# Patient Record
Sex: Female | Born: 1970 | Race: White | Hispanic: No | Marital: Married | Smoking: Never smoker
Health system: Southern US, Community
[De-identification: ages and names within clinical notes are randomized; demographics above are authoritative.]

## PROBLEM LIST (undated history)

## (undated) DIAGNOSIS — Z8669 Personal history of other diseases of the nervous system and sense organs: Secondary | ICD-10-CM

## (undated) DIAGNOSIS — I10 Essential (primary) hypertension: Secondary | ICD-10-CM

## (undated) DIAGNOSIS — E785 Hyperlipidemia, unspecified: Secondary | ICD-10-CM

## (undated) DIAGNOSIS — I8390 Asymptomatic varicose veins of unspecified lower extremity: Secondary | ICD-10-CM

## (undated) DIAGNOSIS — H05822 Myopathy of extraocular muscles, left orbit: Secondary | ICD-10-CM

## (undated) HISTORY — PX: COLONOSCOPY: SHX174

## (undated) HISTORY — PX: OTHER SURGICAL HISTORY: SHX169

## (undated) HISTORY — DX: Essential (primary) hypertension: I10

## (undated) HISTORY — DX: Asymptomatic varicose veins of unspecified lower extremity: I83.90

## (undated) HISTORY — DX: Myopathy of extraocular muscles, left orbit: H05.822

## (undated) HISTORY — DX: Personal history of other diseases of the nervous system and sense organs: Z86.69

## (undated) HISTORY — DX: Hyperlipidemia, unspecified: E78.5

---

## 2009-01-06 HISTORY — PX: APPENDECTOMY: SHX54

## 2011-10-13 ENCOUNTER — Ambulatory Visit (INDEPENDENT_AMBULATORY_CARE_PROVIDER_SITE_OTHER): Payer: 59 | Admitting: Physician Assistant

## 2011-10-13 VITALS — BP 126/90 | HR 92 | Temp 97.8°F | Resp 16 | Ht 65.5 in | Wt 210.0 lb

## 2011-10-13 DIAGNOSIS — J029 Acute pharyngitis, unspecified: Secondary | ICD-10-CM

## 2011-10-13 DIAGNOSIS — J019 Acute sinusitis, unspecified: Secondary | ICD-10-CM

## 2011-10-13 MED ORDER — CLARITHROMYCIN ER 500 MG PO TB24: 1000.0000 mg | ORAL_TABLET | Freq: Every day | ORAL | Status: AC

## 2011-10-13 MED ORDER — IPRATROPIUM BROMIDE 0.03 % NA SOLN: 2.0000 | Freq: Two times a day (BID) | NASAL | Status: DC

## 2011-10-13 MED ORDER — GUAIFENESIN ER 1200 MG PO TB12: 1.0000 | ORAL_TABLET | Freq: Two times a day (BID) | ORAL | Status: DC | PRN

## 2011-10-13 MED ORDER — AMBULATORY NON FORMULARY MEDICATION: Status: DC

## 2011-10-13 NOTE — Progress Notes (Signed)
  Subjective:    Patient ID: Kathy Morris, female    DOB: [birth year], 41 y.o.   MRN: [id number]  HPI This 41 y.o. Female presents for sore throat x 2 weeks.  Seen at PCP 8 days ago where rapid strep test was negative.  Has since developed cough, productive of yellow sputum, and sinus congestion.  Low grade fever and abdominal pain initially, now resolved.  NO HA, nausea, vomiting, diarrhea.  Initially, her kids had sore throat and abdominal pain, but are now well.  Visiting here from [place].  Review of Systems  As above.  History reviewed. No pertinent past medical history.  Past Surgical History  Procedure Date  . Appendectomy 01/06/2009    Prior to Admission medications   Not on File    Allergies  Allergen Reactions  . Amoxicillin Hives  . Tetracyclines & Related Hives    History   Social History  . Marital Status: Married    Spouse Name: Piedad Standiford    Number of Children: 2  . Years of Education: 16   Occupational History  . SALES    Social History Main Topics  . Smoking status: Never Smoker   . Smokeless tobacco: Never Used  . Alcohol Use: 4.2 oz/week    7 Glasses of wine per week  . Drug Use: No  . Sexually Active: Yes -- Female partner(s)    Birth Control/ Protection: Condom   Other Topics Concern  . Not on file   Social History Narrative  . No narrative on file    Family History  Problem Relation Age of Onset  . Thyroid disease Mother   . Cancer Father 84    colon       Objective:   Physical Exam Blood pressure 126/90, pulse 92, temperature 97.8 F (36.6 C), temperature source Oral, resp. rate 16, height 5' 5.5" (1.664 m), weight 210 lb (95.255 kg), last menstrual period 09/22/2011, SpO2 98.00%. Body mass index is 34.41 kg/(m^2). Well-developed, well nourished WF who is awake, alert and oriented, in NAD. HEENT: /AT, PERRL, EOMI.  Sclera and conjunctiva are clear.  EAC are patent, TMs are normal in appearance. Nasal mucosa is  pink and moist. OP is clear. Neck: supple, non-tender, no lymphadenopathy, thyromegaly. Heart: RRR, no murmur Lungs: CTA Extremities: no cyanosis, clubbing or edema. Skin: warm and dry without rash.     Assessment & Plan:   1. Acute pharyngitis  AMBULATORY NON FORMULARY MEDICATION  2. Acute sinusitis, unspecified  clarithromycin (BIAXIN XL) 500 MG 24 hr tablet, ipratropium (ATROVENT) 0.03 % nasal spray, Guaifenesin (MUCINEX MAXIMUM STRENGTH) 1200 MG TB12   Patient Instructions  Get plenty of rest and drink at least 64 ounces of water daily.

## 2011-10-13 NOTE — Patient Instructions (Signed)
Get plenty of rest and drink at least 64 ounces of water daily. 

## 2014-10-02 ENCOUNTER — Emergency Department (HOSPITAL_COMMUNITY)
Admission: EM | Admit: 2014-10-02 | Discharge: 2014-10-02 | Disposition: A | Discharge status: Home / Self Care | Payer: 59 | Attending: Emergency Medicine | Admitting: Emergency Medicine

## 2014-10-02 ENCOUNTER — Encounter (HOSPITAL_COMMUNITY): Payer: Self-pay | Admitting: Family Medicine

## 2014-10-02 ENCOUNTER — Emergency Department (HOSPITAL_COMMUNITY): Payer: 59

## 2014-10-02 ENCOUNTER — Other Ambulatory Visit: Payer: Self-pay

## 2014-10-02 ENCOUNTER — Encounter (HOSPITAL_COMMUNITY): Payer: Self-pay | Admitting: *Deleted

## 2014-10-02 DIAGNOSIS — Z880 Allergy status to penicillin: Secondary | ICD-10-CM | POA: Insufficient documentation

## 2014-10-02 DIAGNOSIS — M549 Dorsalgia, unspecified: Secondary | ICD-10-CM | POA: Insufficient documentation

## 2014-10-02 DIAGNOSIS — E860 Dehydration: Secondary | ICD-10-CM | POA: Insufficient documentation

## 2014-10-02 DIAGNOSIS — H5712 Ocular pain, left eye: Secondary | ICD-10-CM

## 2014-10-02 DIAGNOSIS — R000 Tachycardia, unspecified: Secondary | ICD-10-CM | POA: Diagnosis not present

## 2014-10-02 DIAGNOSIS — Z79899 Other long term (current) drug therapy: Secondary | ICD-10-CM | POA: Diagnosis not present

## 2014-10-02 DIAGNOSIS — R002 Palpitations: Secondary | ICD-10-CM | POA: Diagnosis not present

## 2014-10-02 LAB — BASIC METABOLIC PANEL
Anion gap: 9 (ref 5–15)
BUN: 9 mg/dL (ref 6–20)
CHLORIDE: 103 mmol/L (ref 101–111)
CO2: 27 mmol/L (ref 22–32)
Calcium: 9.3 mg/dL (ref 8.9–10.3)
Creatinine, Ser: 0.76 mg/dL (ref 0.44–1.00)
GFR calc Af Amer: >60 mL/min (ref >60)
Glucose, Bld: 104 mg/dL — ABNORMAL HIGH (ref 65–99)
POTASSIUM: 3.8 mmol/L (ref 3.5–5.1)
Sodium: 139 mmol/L (ref 135–145)

## 2014-10-02 LAB — CBC
HEMATOCRIT: 43.4 % (ref 36.0–46.0)
HEMOGLOBIN: 14.8 g/dL (ref 12.0–15.0)
MCH: 30.5 pg (ref 26.0–34.0)
MCHC: 34.1 g/dL (ref 30.0–36.0)
MCV: 89.5 fL (ref 78.0–100.0)
Platelets: 228 10*3/uL (ref 150–400)
RBC: 4.85 MIL/uL (ref 3.87–5.11)
RDW: 13.7 % (ref 11.5–15.5)
WBC: 12.5 10*3/uL — AB (ref 4.0–10.5)

## 2014-10-02 LAB — I-STAT TROPONIN, ED: Troponin i, poc: 0 ng/mL (ref 0.00–0.08)

## 2014-10-02 MED ORDER — FLUORESCEIN SODIUM 1 MG OP STRP
1.0000 | ORAL_STRIP | Freq: Once | OPHTHALMIC | Status: AC
  Administered 2014-10-02: 1 via OPHTHALMIC
  Filled 2014-10-02: qty 1

## 2014-10-02 MED ORDER — TETRACAINE HCL 0.5 % OP SOLN
1.0000 [drp] | Freq: Once | OPHTHALMIC | Status: AC
Start: 2014-10-02 — End: 2014-10-02
  Administered 2014-10-02: 1 [drp] via OPHTHALMIC
  Filled 2014-10-02: qty 2

## 2014-10-02 MED ORDER — ONDANSETRON 4 MG PO TBDP
4.0000 mg | ORAL_TABLET | Freq: Once | ORAL | Status: AC
  Administered 2014-10-02: 4 mg via ORAL
  Filled 2014-10-02: qty 1

## 2014-10-02 NOTE — ED Notes (Addendum)
Pt here for left eye pain, soreness. sts it feels like a muscle strain. sts that she just doesn't feel well in general. sts recent prescription change last week.

## 2014-10-02 NOTE — ED Notes (Signed)
Called x2 for triage  no answer  

## 2014-10-02 NOTE — Discharge Instructions (Signed)
Palpitations Return to the ED for chest pain, shortness of breath, sweating. Follow-up with [place] and wellness. Your eye exam today was normal. If she continued to have issues please see your ophthalmologist. A palpitation is the feeling that your heartbeat is irregular. It may feel like your heart is fluttering or skipping a beat. It may also feel like your heart is beating faster than normal. This is usually not a serious problem. In some cases, you may need more medical tests. HOME CARE  Avoid:  Caffeine in coffee, tea, soft drinks, diet pills, and energy drinks.  Chocolate.  Alcohol.  Stop smoking if you smoke.  Reduce your stress and anxiety. Try:  A method that measures bodily functions so you can learn to control them (biofeedback).  Yoga.  Meditation.  Physical activity such as swimming, jogging, or walking.  Get plenty of rest and sleep. GET HELP IF:  Your fast or irregular heartbeat continues after 24 hours.  Your palpitations occur more often. GET HELP RIGHT AWAY IF:   You have chest pain.  You feel short of breath.  You have a very bad headache.  You feel dizzy or pass out (faint). MAKE SURE YOU:   Understand these instructions.  Will watch your condition.  Will get help right away if you are not doing well or get worse. Document Released: 12/04/2007 Document Revised: 07/11/2013 Document Reviewed: 04/25/2011 [facility] Patient Information 2015 [place], [place]. This information is not intended to replace advice given to you by your health care provider. Make sure you discuss any questions you have with your health care provider.

## 2014-10-02 NOTE — ED Provider Notes (Signed)
CSN: [id number]     Arrival date & time 10/02/14  1432 History   This chart was scribed for Catha Gosselin, PA-C working with Blane Ohara, MD by Evon Slack, ED Scribe. This patient was seen in room TR04C/TR04C and the patient's care was started at 3:02 PM.    Chief Complaint  Patient presents with  . Eye Pain   The history is provided by the patient. No language interpreter was used.   HPI Comments: Kathy Morris is a 44 y.o. female who presents to the Emergency Department complaining of left eye pain onset 2 months prior. Pt states she went to see her Eye doctor where she had a normal retinal exam and negative glaucoma test. Pt states that she did receive a new eye glasses prescription. She states that she has had an intermittent "weird sensation" in the left eye for the past 2 months that would only last for 20 minutes at a time. She describes the feeling as an tightness or stain on the eye.  Pt denies vision changes or other related symptoms. Pt denies contact use. She denies any fever, chills, pain with movement of the eye, vision changes, and swelling of the eye.  History reviewed. No pertinent past medical history. Past Surgical History  Procedure Laterality Date  . Appendectomy  01/06/2009   Family History  Problem Relation Age of Onset  . Thyroid disease Mother   . Cancer Father 26    colon   History  Substance Use Topics  . Smoking status: Never Smoker   . Smokeless tobacco: Never Used  . Alcohol Use: 4.2 oz/week    7 Glasses of wine per week   OB History    No data available      Review of Systems  Constitutional: Negative for fever.  HENT: Negative for facial swelling.   Eyes: Negative for photophobia, pain, discharge, redness, itching and visual disturbance.  All other systems reviewed and are negative.    Allergies  Amoxicillin and Tetracyclines & related  Home Medications   Prior to Admission medications   Medication Sig Start Date End Date  Taking? Authorizing Provider  AMBULATORY NON FORMULARY MEDICATION MIX: 90 ml Cherry Mylanta, 90 ml Cherry Benadryl, 30 ml Cherry Hurricane liquid.  5-10 ml PO swish and spit or swallow, Q2 hours, prn sore throat. 10/13/11   Chelle Jeffery, PA-C  Guaifenesin (MUCINEX MAXIMUM STRENGTH) 1200 MG TB12 Take 1 tablet (1,200 mg total) by mouth every 12 (twelve) hours as needed. 10/13/11   Chelle Jeffery, PA-C  ipratropium (ATROVENT) 0.03 % nasal spray Place 2 sprays into the nose 2 (two) times daily. 10/13/11 10/12/12  Chelle Jeffery, PA-C   BP 139/88 mmHg  Pulse 82  Temp(Src) 98.1 F (36.7 C) (Oral)  Resp 16  SpO2 100%  LMP 09/18/2014   Physical Exam  Constitutional: She is oriented to person, place, and time. She appears well-developed and well-nourished. No distress.  HENT:  Head: Normocephalic and atraumatic.  Eyes: Conjunctivae, EOM and lids are normal. Pupils are equal, round, and reactive to light. Lids are everted and swept, no foreign bodies found. Right eye exhibits no discharge. Left eye exhibits no discharge.  Fluoresceins stain was negative for Seidel sign. There is no corneal abrasion or foreign body seen. No pain with extraocular movements. Pupils were equal and reactive to light and accommodation. There is no swelling or erythema of the eyelid or surrounding the eye.  Neck: Neck supple. No tracheal deviation present.  Cardiovascular: Normal rate,  regular rhythm and normal heart sounds.   Pulmonary/Chest: Effort normal and breath sounds normal. No respiratory distress. She has no wheezes. She has no rales. She exhibits no tenderness.  Abdominal: Soft. She exhibits no distension.  Musculoskeletal: Normal range of motion.  Neurological: She is alert and oriented to person, place, and time.  Skin: Skin is warm and dry.  Psychiatric: She has a normal mood and affect. Her behavior is normal.  Nursing note and vitals reviewed.   ED Course  Procedures (including critical care  time) DIAGNOSTIC STUDIES: Oxygen Saturation is 100% on room air.  COORDINATION OF CARE: 3:21 PM-Discussed treatment plan with pt at bedside and pt agreed to plan.     Labs Review Labs Reviewed - No data to display  Imaging Review No results found.   EKG Interpretation   Date/Time:  Monday October 02 2014 16:28:29 EDT Ventricular Rate:  106 PR Interval:  160 QRS Duration: 86 QT Interval:  352 QTC Calculation: 467 R Axis:   85 Text Interpretation:  Sinus tachycardia Otherwise normal ECG Sinus  tachycardia Abnormal ekg Confirmed by Gerhard Munch  MD ([id number]) on  10/02/2014 4:36:57 PM      MDM   Final diagnoses:  Eye pain, left  Palpitations  Patient presents for a "funny feeling" like a muscle pull in the left eye. She states she is not having any pain. She had this feeling 2 months ago and was evaluated by an ophthalmologist who checked for glaucoma as well as retinal detachment but everything was normal. She states that she feels jittery and anxious. Her fluorescein exam was normal. I used the tanometer which showed normal pressure of the eye of 17. There is no ecchymosis or erythema surrounding the eye. The conjunctiva appears normal. Visual acuity is 20/15, right is 20/15, left is 20/20.  Patient states she is now feeling palpitations and is nauseous. EKG shows rate of 106 but is otherwise normal. I showed this to Dr. Jeraldine Loots who agrees. I discussed return precautions with the patient and gave her follow-up to [facility] health and wellness. Patient verbally agrees with the plan. Medications  fluorescein ophthalmic strip 1 strip (1 strip Left Eye Given 10/02/14 1524)  tetracaine (PONTOCAINE) 0.5 % ophthalmic solution 1 drop (1 drop Left Eye Given 10/02/14 1524)  ondansetron (ZOFRAN-ODT) disintegrating tablet 4 mg (4 mg Oral Given 10/02/14 1608)   I personally performed the services described in this documentation, which was scribed in my presence. The recorded information has been  reviewed and is accurate.     Catha Gosselin, PA-C 10/02/14 1748  Blane Ohara, MD 10/03/14 [phone]

## 2014-10-02 NOTE — ED Notes (Signed)
Pt in c/o episode of palpitations, weakness, dizziness, states her mouth got all dry and c/o upper back pain. Seen here earlier for not feeling well, denies pain at this time.

## 2014-10-03 ENCOUNTER — Emergency Department (HOSPITAL_COMMUNITY)
Admission: EM | Admit: 2014-10-03 | Discharge: 2014-10-03 | Disposition: A | Discharge status: Home / Self Care | Payer: 59 | Attending: Emergency Medicine | Admitting: Emergency Medicine

## 2014-10-03 DIAGNOSIS — E860 Dehydration: Secondary | ICD-10-CM

## 2014-10-03 DIAGNOSIS — R000 Tachycardia, unspecified: Secondary | ICD-10-CM

## 2014-10-03 LAB — URINALYSIS, ROUTINE W REFLEX MICROSCOPIC
BILIRUBIN URINE: NEGATIVE
GLUCOSE, UA: NEGATIVE mg/dL
Hgb urine dipstick: NEGATIVE
KETONES UR: 40 mg/dL — AB
LEUKOCYTES UA: NEGATIVE
Nitrite: NEGATIVE
Protein, ur: NEGATIVE mg/dL
SPECIFIC GRAVITY, URINE: 1.005 (ref 1.005–1.030)
Urobilinogen, UA: 0.2 mg/dL (ref 0.0–1.0)
pH: 5.5 (ref 5.0–8.0)

## 2014-10-03 LAB — D-DIMER, QUANTITATIVE: D-Dimer, Quant: <0.27 ug/mL-FEU (ref 0.00–0.48)

## 2014-10-03 LAB — TSH: TSH: 3.231 u[IU]/mL (ref 0.350–4.500)

## 2014-10-03 LAB — T4, FREE: FREE T4: 1.03 ng/dL (ref 0.61–1.12)

## 2014-10-03 MED ORDER — SODIUM CHLORIDE 0.9 % IV BOLUS (SEPSIS)
1000.0000 mL | Freq: Once | INTRAVENOUS | Status: AC
Start: 2014-10-03 — End: 2014-10-03
  Administered 2014-10-03: 1000 mL via INTRAVENOUS

## 2014-10-03 MED ORDER — SODIUM CHLORIDE 0.9 % IV BOLUS (SEPSIS)
1000.0000 mL | Freq: Once | INTRAVENOUS | Status: AC
  Administered 2014-10-03: 1000 mL via INTRAVENOUS

## 2014-10-03 NOTE — ED Provider Notes (Signed)
CSN: [id number]     Arrival date & time 10/02/14  2247 History   First MD Initiated Contact with Patient 10/03/14 0123     Chief Complaint  Patient presents with  . Palpitations     (Consider location/radiation/quality/duration/timing/severity/associated sxs/prior Treatment) HPI Comments: This is a normally healthy 44 year old female who was seen earlier in the emergency department for left eye pain that had been going on for the past several months.  She's been seen by her ophthalmologist without any definitive diagnosis.  She presented tonight with palpitations and a weird odd sensation in her lower back and nausea.  She mentioned this on her earlier evaluation without any definitive diagnosis as well. She does report that she has been dieting with nutrition supplements and an herbal product for the past 3 months and has lost 45 pounds, but does not report any new medications or supplements.  She does state that she recently returned from a trip to [place] approximately one week ago.  She denies any swelling in her legs or calf pain.  She does report that she has a family history of thyroid disease on her mother's side, but no personal history of thyroid disease  Patient is a 44 y.o. female presenting with palpitations. The history is provided by the patient.  Palpitations Palpitations quality:  Fast Onset quality:  At rest Timing:  Constant Progression:  Unchanged Chronicity:  New Context: not anxiety, not appetite suppressants, not blood loss, not caffeine, not dehydration, not exercise and not stimulant use   Relieved by:  Nothing Worsened by:  Nothing Ineffective treatments:  None tried Associated symptoms: back pain, leg pain and nausea   Associated symptoms: no chest pain, no dizziness, no lower extremity edema and no shortness of breath   Risk factors: no hx of PE     History reviewed. No pertinent past medical history. Past Surgical History  Procedure Laterality Date  .  Appendectomy  01/06/2009   Family History  Problem Relation Age of Onset  . Thyroid disease Mother   . Cancer Father 61    colon   History  Substance Use Topics  . Smoking status: Never Smoker   . Smokeless tobacco: Never Used  . Alcohol Use: 4.2 oz/week    7 Glasses of wine per week   OB History    No data available     Review of Systems  Constitutional: Negative for fever.  Respiratory: Negative for shortness of breath.   Cardiovascular: Positive for palpitations. Negative for chest pain and leg swelling.  Gastrointestinal: Positive for nausea.  Musculoskeletal: Positive for myalgias and back pain. Negative for neck pain.  Skin: Negative for rash.  Neurological: Negative for dizziness and headaches.  All other systems reviewed and are negative.     Allergies  Amoxicillin and Tetracyclines & related  Home Medications   Prior to Admission medications   Medication Sig Start Date End Date Taking? Authorizing Provider  MAGNESIUM CITRATE PO Take 1 tablet by mouth daily.   Yes Historical Provider, MD  OVER THE COUNTER MEDICATION Take 2 tablets by mouth daily. Blended herbal vitamin   Yes Historical Provider, MD   BP 123/80 mmHg  Pulse 86  Temp(Src) 98.6 F (37 C) (Oral)  Resp 13  SpO2 100%  LMP 09/18/2014 Physical Exam  Constitutional: She is oriented to person, place, and time. She appears well-developed and well-nourished.  HENT:  Head: Normocephalic.  Mouth/Throat: Oropharynx is clear and moist.  Eyes: Pupils are equal, round, and reactive  to light.  Neck: Normal range of motion.  Cardiovascular: Regular rhythm.  Tachycardia present.   No murmur heard. Pulmonary/Chest: Effort normal and breath sounds normal. No respiratory distress.  Abdominal: Soft.  Musculoskeletal: Normal range of motion. She exhibits no edema or tenderness.  Lymphadenopathy:    She has no cervical adenopathy.  Neurological: She is alert and oriented to person, place, and time.  Skin:  Skin is warm. No erythema.  Nursing note and vitals reviewed.   ED Course  Procedures (including critical care time) Labs Review Labs Reviewed  BASIC METABOLIC PANEL - Abnormal; Notable for the following:    Glucose, Bld 104 (*)    All other components within normal limits  CBC - Abnormal; Notable for the following:    WBC 12.5 (*)    All other components within normal limits  URINALYSIS, ROUTINE W REFLEX MICROSCOPIC (NOT AT [facility]) - Abnormal; Notable for the following:    Ketones, ur 40 (*)    All other components within normal limits  TSH  T4, FREE  D-DIMER, QUANTITATIVE (NOT AT [facility])  Rosezena Sensor, ED    Imaging Review Dg Chest 2 View  10/03/2014   CLINICAL DATA:  Weakness, nausea and back discomfort. Onset tonight.  EXAM: CHEST  2 VIEW  COMPARISON:  None.  FINDINGS: The heart size and mediastinal contours are within normal limits. Both lungs are clear. The visualized skeletal structures are unremarkable.  IMPRESSION: No active cardiopulmonary disease.   Electronically Signed   By: Ellery Plunk M.D.   On: 10/03/2014 00:08     EKG Interpretation   Date/Time:  Monday October 02 2014 22:54:16 EDT Ventricular Rate:  116 PR Interval:  150 QRS Duration: 86 QT Interval:  326 QTC Calculation: 453 R Axis:   84 Text Interpretation:  Sinus tachycardia Right atrial enlargement  Borderline ECG Confirmed by OTTER  MD, OLGA ([id number]) on 10/03/2014 4:04:29  AM     Patient's labs were reviewed.  The only abnormality issues spilling ketones in her urine.  She's been given 2 L of IV fluid.  Heart rate normalized into the 80s.  Thyroid levels were checked.  These are within normal parameters.  Troponin and EKG show sinus tachycardia with a negative troponin.  Do not feel this is cardiac in nature but more related to her recent dieting.  An herbal product use.  She's been given resource guide to help her find a primary care physician MDM   Final diagnoses:  Dehydration, moderate   Tachycardia         Earley Favor, NP 10/03/14 6045  Marisa Severin, MD 10/03/14 [phone]

## 2014-10-03 NOTE — Discharge Instructions (Signed)
Dehydration, Adult Dehydration means your body does not have as much fluid as it needs. Your kidneys, brain, and heart will not work properly without the right amount of fluids and salt.  HOME CARE  Ask your doctor how to replace body fluid losses (rehydrate).  Drink enough fluids to keep your pee (urine) clear or pale yellow.  Drink small amounts of fluids often if you feel sick to your stomach (nauseous) or throw up (vomit).  Eat like you normally do.  Avoid:  Foods or drinks high in sugar.  Bubbly (carbonated) drinks.  Juice.  Very hot or cold fluids.  Drinks with caffeine.  Fatty, greasy foods.  Alcohol.  Tobacco.  Eating too much.  Gelatin desserts.  Wash your hands to avoid spreading germs (bacteria, viruses).  Only take medicine as told by your doctor.  Keep all doctor visits as told. GET HELP RIGHT AWAY IF:   You cannot drink something without throwing up.  You get worse even with treatment.  Your vomit has blood in it or looks greenish.  Your poop (stool) has blood in it or looks black and tarry.  You have not peed in 6 to 8 hours.  You pee a small amount of very dark pee.  You have a fever.  You pass out (faint).  You have belly (abdominal) pain that gets worse or stays in one spot (localizes).  You have a rash, stiff neck, or bad headache.  You get easily annoyed, sleepy, or are hard to wake up.  You feel weak, dizzy, or very thirsty. MAKE SURE YOU:   Understand these instructions.  Will watch your condition.  Will get help right away if you are not doing well or get worse. Document Released: 12/21/2008 Document Revised: 05/19/2011 Document Reviewed: 10/14/2010 [facility] Patient Information 2015 [place], [place]. This information is not intended to replace advice given to you by your health care provider. Make sure you discuss any questions you have with your health care provider. You were seen for recurrent persistent tachycardia and  some muscle discomfort in your back was found that you are mildly dehydrated.  You've been given IV fluids.  Your heart rate to come back into a normal rate.  You had your thyroid levels checked, which are normal.  Chest x-ray does not show any pneumonia.  The d-dimer that was performed to rule out pulmonary embolus was normal. Below.  You were given a list of physicians accepting new patients.  Please try to make an appointment with a primary care provider   Emergency Department Resource Guide 1) Find a Doctor and Pay Out of Pocket Although you won't have to find out who is covered by your insurance plan, it is a good idea to ask around and get recommendations. You will then need to call the office and see if the doctor you have chosen will accept you as a new patient and what types of options they offer for patients who are self-pay. Some doctors offer discounts or will set up payment plans for their patients who do not have insurance, but you will need to ask so you aren't surprised when you get to your appointment.  2) Contact Your Local Health Department Not all health departments have doctors that can see patients for sick visits, but many do, so it is worth a call to see if yours does. If you don't know where your local health department is, you can check in your phone book. The CDC also has a  tool to help you locate your state's health department, and many state websites also have listings of all of their local health departments.  3) Find a Walk-in Clinic If your illness is not likely to be very severe or complicated, you may want to try a walk in clinic. These are popping up all over the country in pharmacies, drugstores, and shopping centers. They're usually staffed by nurse practitioners or physician assistants that have been trained to treat common illnesses and complaints. They're usually fairly quick and inexpensive. However, if you have serious medical issues or chronic medical problems,  these are probably not your best option.  No Primary Care Doctor: - Call Health Connect at  [phone] - they can help you locate a primary care doctor that  accepts your insurance, provides certain services, etc. - Physician Referral Service- [phone]  Chronic Pain Problems: Organization         Address  Phone   Notes  [facility]  [phone] Patients need to be referred by their primary care doctor.   Medication Assistance: Organization         Address  Phone   Notes  [facility] Medication [facility] [address] [place] [place], [place] [address] [phone] --Must be a resident of [facility] -- Must have NO insurance coverage whatsoever (no Medicaid/ Medicare, etc.) -- The pt. MUST have a primary care doctor that directs their care regularly and follows them in the community   [facility]  [phone]   [employer]  [phone]    Agencies that provide inexpensive medical care: Organization         Address  Phone   Notes  [facility]  [phone]   [facility]    [phone]   [facility] [address] [place], [place] [address] [phone]   [facility] [address] [place] [place] [phone]   Planned Parenthood    [phone]   [facility]    [phone]   [facility]  [address], [place] Phone:  [phone], Fax:  [phone] Hours of Operation:  9 am - 6 pm, M-F.  Also accepts Medicaid/Medicare and self-pay.  [facility]  [address], [place] Phone: [phone], Fax: [phone]. Hours of Operation:  8:30 am - 5:30 pm, M-F.  Also accepts Medicaid and self-pay.  [facility] [place] [address] [place] Phone: [phone]   [facility] [address] Natasha Bence [place], [place] [phone], Ext. [phone] Mondays &  Thursdays: 7-9 AM.  First 15 patients are seen on a first come, first serve basis.    Medicaid-accepting [facility] Providers:  Organization         Address  Phone   Notes  [facility] [address], [place] [phone] Also accepts self-pay patients.  [facility] [address] Laurell Josephs [place], [place]  [phone]   [facility] [address], [place] [phone]   [facility] [address] [place] [address] [place] [phone]   Renaye Rakers [address], [place]   [phone] Only accepts [place] Access [place] patients after they have their name applied to their card.   Self-Pay (no insurance) in [place]:  Organization         Address  Phone   Notes  Sickle Cell Patients, [facility] Internal Medicine [address] [place], [place] [phone]   [facility] [address] [place], [place] [phone]   [facility] [place]  [address], [place] [phone]   [facility]/Dr. Osei-Bonsu  [address] [place] [address], [place] or [address], [place] [phone] Phone number for both [place] and [place] locations is the same.  [facility] [address] [place] [phone]   [facility] [address] [place] or [address] [phone]   [facility] [address] [place] [phone], phone; [phone], fax Sees patients 1st and 3rd Saturday of every month.  Must not qualify for public or private insurance (i.e. Medicaid, Medicare, [place] Health Choice, Veterans' Benefits)  Household income should be no more than 200% of the poverty level The clinic cannot treat you if you are pregnant or think you are pregnant  Sexually transmitted diseases are not treated at the  clinic.    Dental Care: Organization         Address  Phone  Notes  [facility] [address] [place], [place] [phone] Accepts children up to age 80 who are enrolled in [place] or [place] Health Choice; pregnant women with a Medicaid card; and children who have applied for Medicaid or [place] Health Choice, but were declined, whose parents can pay a reduced fee at time of service.  [facility]  [address], [place] [phone] Accepts children up to age 88 who are enrolled in [place] or [place] Health Choice; pregnant women with a Medicaid card; and children who have applied for Medicaid or  Health Choice, but were declined, whose parents can pay a reduced fee at time of service.  [facility]  [address] [place], [place] [phone] Patients are seen by appointment only. Walk-ins are not accepted. [facility] will see patients 57 years of age and older. Monday - Tuesday (8am-5pm) Most Wednesdays (8:30-5pm) $30 per visit, cash only  [facility] Access [facility]  [address], [facility] [phone] Patients are seen by appointment only. Walk-ins are not accepted. [facility] will see patients 28 years of age and older. One Wednesday Evening (Monthly: Volunteer Based).  $30 per visit, cash only  [employer]  [phone] for adults; Children under age 30, call Graduate Pediatric Dentistry at [phone]. Children aged 93-14, please call [phone] to request a pediatric application.  Dental services are provided in all areas of dental care including fillings, crowns and bridges, complete and partial dentures, implants, gum treatment, root canals, and extractions. Preventive care is also provided. Treatment is provided to both adults and children. Patients are selected via a lottery and there is often a  waiting list.   [facility] [address], [place]  [phone] [email or web address]   [facility] [address] [place], [place] [phone], Ext. [phone] Second and Fourth Thursday of each month, opens at 6:30 AM; Clinic ends at 9 AM.  Patients are seen on a first-come first-served basis, and a limited number are seen during each clinic.  [facility]  [address] Ether Griffins [place], [place] [phone]   Eligibility Requirements You must have lived in [place], [place], or [place] counties for at least the last three months.   You cannot be eligible for state or federal sponsored [place], including [employer], [place], or [employer].   You generally cannot be eligible for healthcare insurance through your employer.    How to apply: Eligibility screenings are held every Tuesday and Wednesday afternoon from 1:00 pm until 4:00 pm. You do not need an appointment for the interview!  [facility] [address] [place], [place] [phone]   [facility] Health Department  [phone]   [facility] Health Department  [phone]   [facility] Health Department  [phone]    Behavioral Health Resources in the Community: Intensive Outpatient Programs Organization         Address  Phone  Notes  [facility] Services [address] [place], [place] [phone]   [facility] [address], [place], [place] [phone]   [facility] [address] [place], [place]  [phone]   [facility] Mental Health [address]  [place], [place] [phone] or [phone]   Substance Abuse Resources Organization         Address  Phone  Notes  [facility] Drug Services  [phone]   [facility]  [phone]   [facility] [place]  [phone]   Floydene Flock  [phone]   Residential & Outpatient Substance Abuse  Program  3376291088   Psychological Services Organization         Address  Phone  Notes  [facility] Behavioral Health  [phone]   [facility] Services  [phone]   [facility] Mental Health [address] [place] [phone] or [phone]    Mobile Crisis Teams Organization         Address  Phone  Notes  [facility], Mobile Crisis Care Unit  [phone]   [facility]  [address] [place], [place] [phone]   Doristine Locks [address] [place] [phone]    Self-Help/Support Groups Organization         Address  Phone             Notes  [facility] [place] - variety of support groups  336- I7437963 Call for more information  Narcotics Anonymous (NA), [facility] [address], [employer] [place]  2 meetings at this location   Statistician         Address  Phone  Notes  [facility] [address],    [place]  [phone]   [facility]  [address], [place] [address], [place], [place] [phone]   [facility] [address] [place], [place] [phone] Admissions: 8am-3pm M-F  [facility] [address],    [place], [place] [phone]   [facility] [address] Starling Manns [place], [place] [phone]   [facility] [address],  [place], [place] [phone]   [facility] - Intensive Outpatient [address] Laurell Josephs [address], [place], [place] [phone]   [facility] (Add[facility] [address] [place]  [place], [place] [phone] or [phone]   [facility] ([facility]) [address], [place], [place] [phone] Accepts Medicaid  [facility] [address],  [place] [phone] Substance Abuse/Addiction Treatment  [facility] Organization          Address  Phone  Notes  [facility]  [phone]   Angie Fava, PhD [address] Ervin Knack [place], [place]   [phone] or [phone]   [facility] Behavioral   [address] [place], [place] [phone]   [facility] [address] [place], [place] [phone] Insurance/Medicaid/sponsorship through [facility] and Families [address]                                    [place], [place] [phone] Therapy/tele-psych/case  [facility] [address] [place] [address], [place] [phone]    Dr. Lolly Mustache  [phone]   [facility]  [employer] [facility] Dept. 1) [address], [place] 2) [address], [place] 3)  [address], [place] [phone]  [phone]   [facility] Child Abuse Hotline (731)630-2826 or [phone] (After Hours)

## 2015-07-27 ENCOUNTER — Ambulatory Visit (INDEPENDENT_AMBULATORY_CARE_PROVIDER_SITE_OTHER): Payer: PRIVATE HEALTH INSURANCE | Admitting: Family Medicine

## 2015-07-27 ENCOUNTER — Encounter: Payer: Self-pay | Admitting: Family Medicine

## 2015-07-27 VITALS — BP 118/78 | HR 76 | Temp 98.1°F | Resp 16 | Ht 66.0 in | Wt 173.2 lb

## 2015-07-27 DIAGNOSIS — Z800 Family history of malignant neoplasm of digestive organs: Secondary | ICD-10-CM | POA: Diagnosis not present

## 2015-07-27 DIAGNOSIS — Z1283 Encounter for screening for malignant neoplasm of skin: Secondary | ICD-10-CM

## 2015-07-27 DIAGNOSIS — I8393 Asymptomatic varicose veins of bilateral lower extremities: Secondary | ICD-10-CM

## 2015-07-27 DIAGNOSIS — H05822 Myopathy of extraocular muscles, left orbit: Secondary | ICD-10-CM | POA: Diagnosis not present

## 2015-07-27 NOTE — Progress Notes (Signed)
Pre visit review using our clinic review tool, if applicable. No additional management support is needed unless otherwise documented below in the visit note. 

## 2015-07-27 NOTE — Patient Instructions (Signed)
Schedule your complete physical at your convenience We'll call you with your vascular, derm, and GI appts Once I hear from Neuro we'll decide if neuro or ophtho is our best choice for your eye symptoms Call with any questions or concerns Welcome!  We're glad to have you!!!

## 2015-07-27 NOTE — Progress Notes (Signed)
   Subjective:    Patient ID: Kathy Morris, female    DOB: 1970/12/08, 45 y.o.   MRN: [id number]  HPI New to establish.  No previous PCP.  No recent GYN.  Family hx of colon cancer- pt's father was dx'd at age 43 but other family members have also had colon cancer.  She was told to start screening at 3740- needs a referral.  Pt has hx of hemorrhoids but has had negative iFOB testing.  No recent change in size or shape of stool.  No abd pain, N/V.  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  Will intermittently hurt and develop large bruises.  Denies aching of legs w/ regular activity.  No CP, SOB.  sxs started during pregnancy ~13 yrs ago and have worsened since.  Varicose veins are present on both upper and lower leg  Skin cancer screening- pt is asking for local derm for yearly exam.  + family hx of melanoma.    L eye strain- pt reports the lateral corner of the eye will have a 'pulling' sensation.  'i can feel that part of my eye is there' when the R eye and medial portion of L eye are not noticeable.  No visual changes during these intermittent episodes.  Does have hx of migraines.    Review of Systems For ROS see HPI      Objective:   Physical Exam  Constitutional: She is oriented to person, place, and time. She appears well-developed and well-nourished. No distress.  HENT:  Head: Normocephalic and atraumatic.  Eyes: Conjunctivae and EOM are normal. Pupils are equal, round, and reactive to light.  Neck: Normal range of motion. Neck supple. No thyromegaly present.  Cardiovascular: Normal rate, regular rhythm, normal heart sounds and intact distal pulses.   No murmur heard. Varicose veins on bilateral LEs, L>R  Pulmonary/Chest: Effort normal and breath sounds normal. No respiratory distress.  Abdominal: Soft. She exhibits no distension. There is no tenderness.  Musculoskeletal: She exhibits no edema.  Lymphadenopathy:    She has no cervical adenopathy.  Neurological: She is alert and oriented to  person, place, and time. She has normal reflexes. No cranial nerve deficit. Coordination normal.  Skin: Skin is warm and dry.  Psychiatric: She has a normal mood and affect. Her behavior is normal.  Vitals reviewed.         Assessment & Plan:

## 2015-08-02 NOTE — Assessment & Plan Note (Signed)
New.  Pt is asking for derm referral.  Referral placed.

## 2015-08-02 NOTE — Assessment & Plan Note (Signed)
New to provider, ongoing for pt.  She denies sxs but is due for colonoscopy.  Referral placed.

## 2015-08-02 NOTE — Assessment & Plan Note (Signed)
New.  Pt would like to have complete assessment and possible tx for this.  Refer to vascular

## 2015-08-02 NOTE — Assessment & Plan Note (Signed)
New.  Pt reports a lateral strain on L eye.  She has seen optometry w/o explanation.  Refer to ophtho for complete evaluation.

## 2015-08-08 ENCOUNTER — Encounter: Payer: Self-pay | Admitting: Gastroenterology

## 2015-09-06 ENCOUNTER — Encounter: Payer: Self-pay | Admitting: Vascular Surgery

## 2015-09-06 ENCOUNTER — Other Ambulatory Visit: Payer: Self-pay | Admitting: *Deleted

## 2015-09-06 DIAGNOSIS — I83813 Varicose veins of bilateral lower extremities with pain: Secondary | ICD-10-CM

## 2015-10-02 ENCOUNTER — Other Ambulatory Visit (HOSPITAL_COMMUNITY)
Admission: RE | Admit: 2015-10-02 | Discharge: 2015-10-02 | Disposition: A | Discharge status: Home / Self Care | Payer: PRIVATE HEALTH INSURANCE | Source: Ambulatory Visit | Attending: Family Medicine | Admitting: Family Medicine

## 2015-10-02 ENCOUNTER — Encounter: Payer: Self-pay | Admitting: Family Medicine

## 2015-10-02 ENCOUNTER — Ambulatory Visit (INDEPENDENT_AMBULATORY_CARE_PROVIDER_SITE_OTHER): Payer: PRIVATE HEALTH INSURANCE | Admitting: Family Medicine

## 2015-10-02 VITALS — BP 112/78 | HR 78 | Temp 98.2°F | Resp 17 | Ht 66.0 in | Wt 174.0 lb

## 2015-10-02 DIAGNOSIS — Z0000 Encounter for general adult medical examination without abnormal findings: Secondary | ICD-10-CM | POA: Diagnosis not present

## 2015-10-02 DIAGNOSIS — Z01411 Encounter for gynecological examination (general) (routine) with abnormal findings: Secondary | ICD-10-CM | POA: Insufficient documentation

## 2015-10-02 DIAGNOSIS — Z124 Encounter for screening for malignant neoplasm of cervix: Secondary | ICD-10-CM | POA: Diagnosis not present

## 2015-10-02 DIAGNOSIS — Z1151 Encounter for screening for human papillomavirus (HPV): Secondary | ICD-10-CM | POA: Insufficient documentation

## 2015-10-02 DIAGNOSIS — Z1231 Encounter for screening mammogram for malignant neoplasm of breast: Secondary | ICD-10-CM

## 2015-10-02 LAB — BASIC METABOLIC PANEL
BUN: 14 mg/dL (ref 6–23)
CALCIUM: 9.2 mg/dL (ref 8.4–10.5)
CO2: 29 mEq/L (ref 19–32)
Chloride: 103 mEq/L (ref 96–112)
Creatinine, Ser: 0.73 mg/dL (ref 0.40–1.20)
GFR: 92.65 mL/min (ref >60.00)
Glucose, Bld: 92 mg/dL (ref 70–99)
Potassium: 4.2 mEq/L (ref 3.5–5.1)
Sodium: 138 mEq/L (ref 135–145)

## 2015-10-02 LAB — CBC WITH DIFFERENTIAL/PLATELET
BASOS PCT: 0.5 % (ref 0.0–3.0)
Basophils Absolute: 0 10*3/uL (ref 0.0–0.1)
EOS ABS: 0 10*3/uL (ref 0.0–0.7)
Eosinophils Relative: 0.7 % (ref 0.0–5.0)
HCT: 41.6 % (ref 36.0–46.0)
Hemoglobin: 14.3 g/dL (ref 12.0–15.0)
LYMPHS ABS: 1.3 10*3/uL (ref 0.7–4.0)
LYMPHS PCT: 26 % (ref 12.0–46.0)
MCHC: 34.3 g/dL (ref 30.0–36.0)
MCV: 90.3 fl (ref 78.0–100.0)
MONOS PCT: 10.2 % (ref 3.0–12.0)
Monocytes Absolute: 0.5 10*3/uL (ref 0.1–1.0)
NEUTROS ABS: 3.1 10*3/uL (ref 1.4–7.7)
Neutrophils Relative %: 62.6 % (ref 43.0–77.0)
PLATELETS: 221 10*3/uL (ref 150.0–400.0)
RBC: 4.61 Mil/uL (ref 3.87–5.11)
RDW: 13 % (ref 11.5–15.5)
WBC: 4.9 10*3/uL (ref 4.0–10.5)

## 2015-10-02 LAB — LIPID PANEL
Cholesterol: 229 mg/dL — ABNORMAL HIGH (ref 0–200)
HDL: 73.6 mg/dL (ref >39.00)
LDL Cholesterol: 138 mg/dL — ABNORMAL HIGH (ref 0–99)
NONHDL: 155.59
Total CHOL/HDL Ratio: 3
Triglycerides: 87 mg/dL (ref 0.0–149.0)
VLDL: 17.4 mg/dL (ref 0.0–40.0)

## 2015-10-02 LAB — HEPATIC FUNCTION PANEL
ALK PHOS: 36 U/L — AB (ref 39–117)
ALT: 16 U/L (ref 0–35)
AST: 17 U/L (ref 0–37)
Albumin: 4.6 g/dL (ref 3.5–5.2)
BILIRUBIN DIRECT: 0.1 mg/dL (ref 0.0–0.3)
BILIRUBIN TOTAL: 0.6 mg/dL (ref 0.2–1.2)
Total Protein: 6.8 g/dL (ref 6.0–8.3)

## 2015-10-02 LAB — VITAMIN D 25 HYDROXY (VIT D DEFICIENCY, FRACTURES): VITD: 29.19 ng/mL — ABNORMAL LOW (ref 30.00–100.00)

## 2015-10-02 LAB — TSH: TSH: 1.05 u[IU]/mL (ref 0.35–4.50)

## 2015-10-02 NOTE — Progress Notes (Signed)
   Subjective:    Patient ID: Kathy Morris, female    DOB: 17-Sep-1970, 45 y.o.   MRN: [id number]  HPI CPE- UTD on Tdap.  Due for 2nd Hep A.  Due for mammo.  Pap today.   Review of Systems Patient reports no vision/ hearing changes, adenopathy,fever, weight change,  persistant/recurrent hoarseness , swallowing issues, chest pain, palpitations, edema, persistant/recurrent cough, hemoptysis, dyspnea (rest/exertional/paroxysmal nocturnal), gastrointestinal bleeding (melena, rectal bleeding), abdominal pain, significant heartburn, bowel changes, GU symptoms (dysuria, hematuria, incontinence), Gyn symptoms (abnormal  bleeding, pain),  syncope, focal weakness, memory loss, numbness & tingling, skin/hair/nail changes, abnormal bruising or bleeding, anxiety, or depression.     Objective:   Physical Exam  General Appearance:    Alert, cooperative, no distress, appears stated age  Head:    Normocephalic, without obvious abnormality, atraumatic  Eyes:    PERRL, conjunctiva/corneas clear, EOM's intact, fundi    benign, both eyes  Ears:    Normal TM's and external ear canals, both ears  Nose:   Nares normal, septum midline, mucosa normal, no drainage    or sinus tenderness  Throat:   Lips, mucosa, and tongue normal; teeth and gums normal  Neck:   Supple, symmetrical, trachea midline, no adenopathy;    Thyroid: no enlargement/tenderness/nodules  Back:     Symmetric, no curvature, ROM normal, no CVA tenderness  Lungs:     Clear to auscultation bilaterally, respirations unlabored  Chest Wall:    No tenderness or deformity   Heart:    Regular rate and rhythm, S1 and S2 normal, no murmur, rub   or gallop  Breast Exam:    No tenderness, masses, or nipple abnormality  Abdomen:     Soft, non-tender, bowel sounds active all four quadrants,    no masses, no organomegaly  Genitalia:    External genitalia normal, cervix red and raw in appearance, no CMT, uterus in normal size and position, adnexa w/out mass  or tenderness, mucosa pink and moist, no lesions or discharge present  Rectal:    Normal external appearance  Extremities:   Extremities normal, atraumatic, no cyanosis or edema  Pulses:   2+ and symmetric all extremities  Skin:   Skin color, texture, turgor normal, no rashes or lesions  Lymph nodes:   Cervical, supraclavicular, and axillary nodes normal  Neurologic:   CNII-XII intact, normal strength, sensation and reflexes    throughout          Assessment & Plan:  Physical exam- WNL w/ exception of erythematous cervix (not TTP).  UTD on Tdap.  Due for mammo- order entered.  Check labs.  Anticipatory guidance provided.

## 2015-10-02 NOTE — Patient Instructions (Signed)
Follow up in 1 year or as needed We'll notify you of your lab results and make any changes if needed We'll call you with your mammo appt Continue to work on healthy diet and regular exercise- you look good! Call with any questions or concerns Have a great summer!!!

## 2015-10-02 NOTE — Progress Notes (Signed)
Pre visit review using our clinic review tool, if applicable. No additional management support is needed unless otherwise documented below in the visit note. 

## 2015-10-03 LAB — CYTOLOGY - PAP

## 2015-10-04 ENCOUNTER — Encounter: Payer: Self-pay | Admitting: Gastroenterology

## 2015-10-04 ENCOUNTER — Ambulatory Visit (INDEPENDENT_AMBULATORY_CARE_PROVIDER_SITE_OTHER): Payer: PRIVATE HEALTH INSURANCE | Admitting: Gastroenterology

## 2015-10-04 VITALS — BP 112/80 | HR 60 | Ht 66.0 in | Wt 172.4 lb

## 2015-10-04 DIAGNOSIS — Z800 Family history of malignant neoplasm of digestive organs: Secondary | ICD-10-CM | POA: Diagnosis not present

## 2015-10-04 MED ORDER — NA SULFATE-K SULFATE-MG SULF 17.5-3.13-1.6 GM/177ML PO SOLN: 1.0000 | Freq: Once | ORAL | 0 refills | Status: AC

## 2015-10-04 NOTE — Patient Instructions (Signed)
You have been scheduled for a colonoscopy. Please follow written instructions given to you at your visit today.  Please pick up your prep supplies at the pharmacy within the next 1-3 days. If you use inhalers (even only as needed), please bring them with you on the day of your procedure. Your physician has requested that you go to www.[email or web address] and enter the access code given to you at your visit today. This web site gives a general overview about your procedure. However, you should still follow specific instructions given to you by our office regarding your preparation for the procedure.  Thank you for choosing me and [facility].  Malcolm T. Stark, Jr., MD., FACG  

## 2015-10-04 NOTE — Progress Notes (Signed)
    History of Present Illness: This is a 45 year old female referred by Sheliah Hatch, MD for the evaluation of a family history of colon cancer. Father with colon cancer at age 28. Several paternal uncles and cousins with colon cancer. Pt had hemorrhoids with mild bleeding about 10 years ago. Denies weight loss, abdominal pain, constipation, diarrhea, change in stool caliber, melena, hematochezia, nausea, vomiting, dysphagia, reflux symptoms, chest pain.   Review of Systems: Pertinent positive and negative review of systems were noted in the above HPI section. All other review of systems were otherwise negative.  Current Medications, Allergies, Past Medical History, Past Surgical History, Family History and Social History were reviewed in [employer] record.  Physical Exam: General: Well developed, well nourished, no acute distress Head: Normocephalic and atraumatic Eyes:  sclerae anicteric, EOMI Ears: Normal auditory acuity Mouth: No deformity or lesions Neck: Supple, no masses or thyromegaly Lungs: Clear throughout to auscultation Heart: Regular rate and rhythm; no murmurs, rubs or bruits Abdomen: Soft, non tender and non distended. No masses, hepatosplenomegaly or hernias noted. Normal Bowel sounds Rectal: deferred to colon Musculoskeletal: Symmetrical with no gross deformities  Skin: No lesions on visible extremities Pulses:  Normal pulses noted Extremities: No clubbing, cyanosis, edema or deformities noted Neurological: Alert oriented x 4, grossly nonfocal Cervical Nodes:  No significant cervical adenopathy Inguinal Nodes: No significant inguinal adenopathy Psychological:  Alert and cooperative. Normal mood and affect  Assessment and Recommendations:  1. Family history of colon cancer-father and several other paternal relatives. Schedule colonoscopy. The risks (including bleeding, perforation, infection, missed lesions, medication reactions and  possible hospitalization or surgery if complications occur), benefits, and alternatives to colonoscopy with possible biopsy and possible polypectomy were discussed with the patient and they consent to proceed.    cc: Sheliah Hatch, MD [address] [place], [place] [address]

## 2015-10-17 ENCOUNTER — Ambulatory Visit (INDEPENDENT_AMBULATORY_CARE_PROVIDER_SITE_OTHER): Payer: PRIVATE HEALTH INSURANCE | Admitting: General Practice

## 2015-10-17 DIAGNOSIS — Z23 Encounter for immunization: Secondary | ICD-10-CM | POA: Diagnosis not present

## 2015-11-14 ENCOUNTER — Ambulatory Visit (AMBULATORY_SURGERY_CENTER): Payer: PRIVATE HEALTH INSURANCE | Admitting: Gastroenterology

## 2015-11-14 ENCOUNTER — Encounter: Payer: Self-pay | Admitting: Gastroenterology

## 2015-11-14 VITALS — BP 112/67 | HR 78 | Temp 97.1°F | Resp 8 | Ht 66.0 in | Wt 172.0 lb

## 2015-11-14 DIAGNOSIS — Z1211 Encounter for screening for malignant neoplasm of colon: Secondary | ICD-10-CM

## 2015-11-14 DIAGNOSIS — Z800 Family history of malignant neoplasm of digestive organs: Secondary | ICD-10-CM | POA: Diagnosis not present

## 2015-11-14 MED ORDER — SODIUM CHLORIDE 0.9 % IV SOLN: 500.0000 mL | INTRAVENOUS | Status: DC

## 2015-11-14 NOTE — Op Note (Signed)
[facility] Patient Name: Kathy Morris Procedure Date: 11/14/2015 1:28 PM MRN: [id number] Endoscopist: Meryl Dare , MD Age: 45 Referring MD:  Date of Birth: 10/26/1970 Gender: Female Account #: [id number] Procedure:                Colonoscopy Indications:              Screening in patient at increased risk: Family                            history of 1st-degree relative with colorectal                            cancer and several paternal 2nd-degree relatives                            with colorectal cancer Medicines:                Monitored Anesthesia Care Procedure:                Pre-Anesthesia Assessment:                           - Prior to the procedure, a History and Physical                            was performed, and patient medications and                            allergies were reviewed. The patient's tolerance of                            previous anesthesia was also reviewed. The risks                            and benefits of the procedure and the sedation                            options and risks were discussed with the patient.                            All questions were answered, and informed consent                            was obtained. Prior Anticoagulants: The patient has                            taken no previous anticoagulant or antiplatelet                            agents. ASA Grade Assessment: II - A patient with                            mild systemic disease. After reviewing the risks  and benefits, the patient was deemed in                            satisfactory condition to undergo the procedure.                           After obtaining informed consent, the colonoscope                            was passed under direct vision. Throughout the                            procedure, the patient's blood pressure, pulse, and                            oxygen saturations were monitored  continuously. The                            Model PCF-[id number]) scope was introduced                            through the anus and advanced to the the cecum,                            identified by appendiceal orifice and ileocecal                            valve. The ileocecal valve, appendiceal orifice,                            and rectum were photographed. The quality of the                            bowel preparation was good. The colonoscopy was                            somewhat difficult due to a tortuous colon.                            Successful completion of the procedure was aided by                            withdrawing and reinserting the scope. The patient                            tolerated the procedure well. Scope In: 1:36:02 PM Scope Out: 1:50:10 PM Scope Withdrawal Time: 0 hours 9 minutes 17 seconds  Total Procedure Duration: 0 hours 14 minutes 8 seconds  Findings:                 The exam was otherwise without abnormality on                            direct and retroflexion views.  Diffuse, severe melanosis was found in the entire                            colon. Complications:            No immediate complications. Estimated blood loss:                            None. Estimated Blood Loss:     Estimated blood loss: none. Impression:               - Melanosis throughout the colon                           - The examination was otherwise normal on direct                            and retroflexion views.                           - No specimens collected. Recommendation:           - Repeat colonoscopy in 5 years for screening                            purposes.                           - Patient has a contact number available for                            emergencies. The signs and symptoms of potential                            delayed complications were discussed with the                            patient.  Return to normal activities tomorrow.                            Written discharge instructions were provided to the                            patient.                           - Resume previous diet.                           - Continue present medications. Meryl Dare, MD 11/14/2015 1:58:39 PM This report has been signed electronically.

## 2015-11-14 NOTE — Patient Instructions (Signed)
Normal exam today. Repeat colonoscopy in 5 years. Resume current medications. Call us with any questions or concerns. Thank you!  YOU HAD AN ENDOSCOPIC PROCEDURE TODAY AT THE [facility]:   Refer to the procedure report that was given to you for any specific questions about what was found during the examination.  If the procedure report does not answer your questions, please call your gastroenterologist to clarify.  If you requested that your care partner not be given the details of your procedure findings, then the procedure report has been included in a sealed envelope for you to review at your convenience later.  YOU SHOULD EXPECT: Some feelings of bloating in the abdomen. Passage of more gas than usual.  Walking can help get rid of the air that was put into your GI tract during the procedure and reduce the bloating. If you had a lower endoscopy (such as a colonoscopy or flexible sigmoidoscopy) you may notice spotting of blood in your stool or on the toilet paper. If you underwent a bowel prep for your procedure, you may not have a normal bowel movement for a few days.  Please Note:  You might notice some irritation and congestion in your nose or some drainage.  This is from the oxygen used during your procedure.  There is no need for concern and it should clear up in a day or so.  SYMPTOMS TO REPORT IMMEDIATELY:   Following lower endoscopy (colonoscopy or flexible sigmoidoscopy):  Excessive amounts of blood in the stool  Significant tenderness or worsening of abdominal pains  Swelling of the abdomen that is new, acute  Fever of 100F or higher  For urgent or emergent issues, a gastroenterologist can be reached at any hour by calling [phone].   DIET:  We do recommend a small meal at first, but then you may proceed to your regular diet.  Drink plenty of fluids but you should avoid alcoholic beverages for 24 hours.  ACTIVITY:  You should plan to take it easy for the rest  of today and you should NOT DRIVE or use heavy machinery until tomorrow (because of the sedation medicines used during the test).    FOLLOW UP: Our staff will call the number listed on your records the next business day following your procedure to check on you and address any questions or concerns that you may have regarding the information given to you following your procedure. If we do not reach you, we will leave a message.  However, if you are feeling well and you are not experiencing any problems, there is no need to return our call.  We will assume that you have returned to your regular daily activities without incident.  If any biopsies were taken you will be contacted by phone or by letter within the next 1-3 weeks.  Please call us at [phone] if you have not heard about the biopsies in 3 weeks.    SIGNATURES/CONFIDENTIALITY: You and/or your care partner have signed paperwork which will be entered into your electronic medical record.  These signatures attest to the fact that that the information above on your After Visit Summary has been reviewed and is understood.  Full responsibility of the confidentiality of this discharge information lies with you and/or your care-partner.

## 2015-11-14 NOTE — Progress Notes (Signed)
Report to PACU, RN, vss, BBS= Clear.  

## 2015-11-15 ENCOUNTER — Encounter: Payer: Self-pay | Admitting: Vascular Surgery

## 2015-11-15 ENCOUNTER — Telehealth: Payer: Self-pay | Admitting: *Deleted

## 2015-11-15 NOTE — Telephone Encounter (Signed)
  Follow up Call-  Call back number 11/14/2015  Post procedure Call Back phone  # [phone]  Permission to leave phone message Yes  Some recent data might be hidden     Patient questions:  Do you have a fever, pain , or abdominal swelling? No. Pain Score  0 *  Have you tolerated food without any problems? Yes.    Have you been able to return to your normal activities? Yes.    Do you have any questions about your discharge instructions: Diet   No. Medications  No. Follow up visit  No.  Do you have questions or concerns about your Care? No.  Actions: * If pain score is 4 or above: No action needed, pain <4.

## 2015-11-21 ENCOUNTER — Encounter: Payer: Self-pay | Admitting: Vascular Surgery

## 2015-11-21 ENCOUNTER — Ambulatory Visit (HOSPITAL_COMMUNITY)
Admission: RE | Admit: 2015-11-21 | Discharge: 2015-11-21 | Disposition: A | Discharge status: Home / Self Care | Payer: PRIVATE HEALTH INSURANCE | Source: Ambulatory Visit | Attending: Vascular Surgery | Admitting: Vascular Surgery

## 2015-11-21 ENCOUNTER — Ambulatory Visit (INDEPENDENT_AMBULATORY_CARE_PROVIDER_SITE_OTHER): Payer: PRIVATE HEALTH INSURANCE | Admitting: Vascular Surgery

## 2015-11-21 VITALS — BP 116/73 | HR 93 | Resp 14 | Ht 64.0 in | Wt 173.0 lb

## 2015-11-21 DIAGNOSIS — I83813 Varicose veins of bilateral lower extremities with pain: Secondary | ICD-10-CM | POA: Diagnosis not present

## 2015-11-21 DIAGNOSIS — I83812 Varicose veins of left lower extremities with pain: Secondary | ICD-10-CM

## 2015-11-21 NOTE — Progress Notes (Signed)
Patient name: Kathy Morris MRN: [id number] DOB: 1970-05-06 Sex: female  REASON FOR CONSULT: Varicose veins. Referred by Dr. Beverely Low.  HPI: Kathy Morris is a 45 y.o. female, Who was referred for evaluation of bilateral lower extremity varicose veins which are more significant on the left side. She began having problems with varicose veins approximate 13 years ago when she had her first child. She develops aching pain and swelling in both legs but especially on the left side. This is aggravated by sitting and standing and relieved somewhat with elevation. She spends a lot of time sitting at her job. She denies any previous history of DVT or phlebitis. She does have problems with bruising when her dog bumps into her leg.  I have reviewed the records from [place] [facility]. The patient has had varicose veins which are more significant on the left side. She has had some aching pain associated with these and also develops occasional bruising.  Past Medical History:  Diagnosis Date  . Extraocular muscle weakness of left eye   . Hx of migraines   . Varicose veins     Family History  Problem Relation Age of Onset  . Thyroid disease Mother   . Colon cancer Father 77  . Heart disease Father   . Heart disease Maternal Grandfather   . Heart disease Paternal Grandfather     SOCIAL HISTORY: Social History   Social History  . Marital status: Married    Spouse name: Kelda Azad  . Number of children: 2  . Years of education: 16   Occupational History  . SALES Self Employed   Social History Main Topics  . Smoking status: Never Smoker  . Smokeless tobacco: Never Used  . Alcohol use 4.2 oz/week    7 Glasses of wine per week  . Drug use: No  . Sexual activity: Yes    Partners: Male    Birth control/ protection: Condom   Other Topics Concern  . Not on file   Social History Narrative  . No narrative on file    Allergies  Allergen Reactions  . Sulfa Antibiotics Hives  .  Amoxicillin Hives  . Tetracyclines & Related Hives    Current Outpatient Prescriptions  Medication Sig Dispense Refill  . OVER THE COUNTER MEDICATION Take 1 tablet by mouth daily. Blended herbal vitamin      Current Facility-Administered Medications  Medication Dose Route Frequency Provider Last Rate Last Dose  . 0.9 %  sodium chloride infusion  500 mL Intravenous Continuous Meryl Dare, MD        REVIEW OF SYSTEMS:  [X]  denotes positive finding, [ ]  denotes negative finding Cardiac  Comments:  Chest pain or chest pressure:    Shortness of breath upon exertion:    Short of breath when lying flat:    Irregular heart rhythm:        Vascular    Pain in calf, thigh, or hip brought on by ambulation:    Pain in feet at night that wakes you up from your sleep:     Blood clot in your veins:    Leg swelling:         Pulmonary    Oxygen at home:    Productive cough:     Wheezing:         Neurologic    Sudden weakness in arms or legs:     Sudden numbness in arms or legs:     Sudden onset of  difficulty speaking or slurred speech:    Temporary loss of vision in one eye:     Problems with dizziness:         Gastrointestinal    Blood in stool:     Vomited blood:         Genitourinary    Burning when urinating:     Blood in urine:        Psychiatric    Major depression:         Hematologic    Bleeding problems:    Problems with blood clotting too easily:        Skin    Rashes or ulcers:        Constitutional    Fever or chills:      PHYSICAL EXAM: Vitals:   11/21/15 1234  BP: 116/73  Pulse: 93  Resp: 14  SpO2: 100%  Weight: 173 lb (78.5 kg)  Height: 5\' 4"  (1.626 m)    GENERAL: The patient is a well-nourished female, in no acute distress. The vital signs are documented above. CARDIAC: There is a regular rate and rhythm.  VASCULAR: I do not detect carotid bruits. She has palpable posterior tibial pulses bilaterally. She has no significant lower extremity  swelling. PULMONARY: There is good air exchange bilaterally without wheezing or rales. ABDOMEN: Soft and non-tender with normal pitched bowel sounds.  MUSCULOSKELETAL: There are no major deformities or cyanosis. NEUROLOGIC: No focal weakness or paresthesias are detected. SKIN: There are no ulcers or rashes noted. She has some telangiectasias and varicose veins along the medial aspect of her left thigh and left leg. She also has a cluster of spider veins on her lateral left thigh and posterior calf. PSYCHIATRIC: The patient has a normal affect.  DATA:   LEFT LOWER EXTREMITY VENOUS DUPLEX: I have independently interpreted her left lower extremity venous duplex can. On the left side, there is no evidence of DVT or superficial thrombophlebitis. There is deep vein reflux involving the common femoral vein, femoral vein, and popliteal vein. There is also reflux in the left great saphenous vein.  MEDICAL ISSUES:  CHRONIC VENOUS INSUFFICIENCY: Based on her duplex scan she has evidence of significant deep vein reflux in the left lower extremity. She does not have significant reflux in the great saphenous vein currently. The varicose veins along the medial aspect of her left leg are being fed by a lateral branch of the saphenous vein however currently this is not significantly dilated. We have discussed the importance of intermittent leg elevation the proper positioning for this. In addition, I have written her a prescription for knee-high compression stockings with a gradient of 15-20 mmHg. I'm encouraged her to avoid prolonged sitting and standing. I encouraged her to exercise as much as possible and to consider water aerobics which I think is very helpful for people with venous disease. If her varicose veins continue to dilate or her symptoms progress and certainly would be happy to see her back in the future. However currently based on her duplex she does not appear to be a candidate for laser ablation of the  saphenous vein.   Waverly Ferrariickson, [facility] [phone]

## 2015-11-22 ENCOUNTER — Ambulatory Visit
Admission: RE | Admit: 2015-11-22 | Discharge: 2015-11-22 | Disposition: A | Discharge status: Home / Self Care | Payer: PRIVATE HEALTH INSURANCE | Source: Ambulatory Visit | Attending: Family Medicine | Admitting: Family Medicine

## 2015-11-22 DIAGNOSIS — Z1231 Encounter for screening mammogram for malignant neoplasm of breast: Secondary | ICD-10-CM

## 2016-05-01 ENCOUNTER — Encounter: Payer: Self-pay | Admitting: *Deleted

## 2016-07-31 ENCOUNTER — Encounter: Payer: Self-pay | Admitting: Family Medicine

## 2016-07-31 ENCOUNTER — Ambulatory Visit (INDEPENDENT_AMBULATORY_CARE_PROVIDER_SITE_OTHER): Payer: PRIVATE HEALTH INSURANCE | Admitting: Family Medicine

## 2016-07-31 VITALS — BP 128/88 | HR 96 | Temp 98.5°F | Resp 16 | Ht 64.0 in | Wt 173.0 lb

## 2016-07-31 DIAGNOSIS — M7061 Trochanteric bursitis, right hip: Secondary | ICD-10-CM

## 2016-07-31 MED ORDER — PREDNISONE 10 MG PO TABS: ORAL_TABLET | ORAL | 0 refills | Status: DC

## 2016-07-31 NOTE — Progress Notes (Signed)
   Subjective:    Patient ID: Kathy BruceKaren Redler, female    DOB: [birth year], 46 y.o.   MRN: [id number]  HPI Pain- 2 days ago R hip started hurting and then overnight, she developed knee pain and ankle pain.  Pt was bit by tick 12 days ago.  No rash, no fevers.  Tick was mildly engorged.  Pt has been playing softball recently.  No change in footwear.  Pain is worse w/ sitting.  sxs improved w/ Motrin.  No notable swelling of joints, no redness or warmth.   Review of Systems For ROS see HPI     Objective:   Physical Exam  Constitutional: She is oriented to person, place, and time. She appears well-developed and well-nourished. No distress.  HENT:  Head: Normocephalic and atraumatic.  Cardiovascular: Intact distal pulses.   Musculoskeletal: She exhibits tenderness (TTP over R greater trochanteric bursa, no TTP over L). She exhibits no edema or deformity.  Pain over R greater trochanteric bursa w/ hip flexion, internal/external rotation  Neurological: She is alert and oriented to person, place, and time.  Skin: Skin is warm and dry. No rash noted. No erythema.  Psychiatric: She has a normal mood and affect. Her behavior is normal. Thought content normal.  Vitals reviewed.         Assessment & Plan:  Trochanteric bursitis- new.  R sided.  Start prednisone as pt leaves for [place] tomorrow.  Reviewed that a tick borne illness would not manifest itself in unilateral joint pain- but symmetric, bilateral pain.  sxs and PE consistent w/ bursitis.  Reviewed supportive care and red flags that should prompt return.  Pt expressed understanding and is in agreement w/ plan.

## 2016-07-31 NOTE — Patient Instructions (Signed)
Follow up as needed This appears to be trochanteric bursitis (inflammation) and will improve with time Start the Prednisone as directed- take w/ food Use tylenol as needed for breakthrough pain ICE! Wear good supportive shoes when walking Call with any questions or concerns Have a great trip!!!

## 2016-11-11 ENCOUNTER — Encounter: Payer: Self-pay | Admitting: Family Medicine

## 2016-11-11 ENCOUNTER — Ambulatory Visit (INDEPENDENT_AMBULATORY_CARE_PROVIDER_SITE_OTHER): Payer: PRIVATE HEALTH INSURANCE | Admitting: Family Medicine

## 2016-11-11 DIAGNOSIS — E669 Obesity, unspecified: Secondary | ICD-10-CM | POA: Diagnosis not present

## 2016-11-11 DIAGNOSIS — E559 Vitamin D deficiency, unspecified: Secondary | ICD-10-CM | POA: Insufficient documentation

## 2016-11-11 DIAGNOSIS — Z0000 Encounter for general adult medical examination without abnormal findings: Secondary | ICD-10-CM | POA: Diagnosis not present

## 2016-11-11 DIAGNOSIS — Z23 Encounter for immunization: Secondary | ICD-10-CM

## 2016-11-11 LAB — BASIC METABOLIC PANEL
BUN: 15 mg/dL (ref 6–23)
CHLORIDE: 102 meq/L (ref 96–112)
CO2: 29 mEq/L (ref 19–32)
Calcium: 9.1 mg/dL (ref 8.4–10.5)
Creatinine, Ser: 0.72 mg/dL (ref 0.40–1.20)
GFR: 93.65 mL/min (ref >60.00)
Glucose, Bld: 97 mg/dL (ref 70–99)
POTASSIUM: 4.1 meq/L (ref 3.5–5.1)
Sodium: 138 mEq/L (ref 135–145)

## 2016-11-11 LAB — CBC WITH DIFFERENTIAL/PLATELET
Basophils Absolute: 0 10*3/uL (ref 0.0–0.1)
Basophils Relative: 0.5 % (ref 0.0–3.0)
EOS PCT: 1.1 % (ref 0.0–5.0)
Eosinophils Absolute: 0.1 10*3/uL (ref 0.0–0.7)
HCT: 42.3 % (ref 36.0–46.0)
HEMOGLOBIN: 14 g/dL (ref 12.0–15.0)
LYMPHS ABS: 1.3 10*3/uL (ref 0.7–4.0)
Lymphocytes Relative: 27.1 % (ref 12.0–46.0)
MCHC: 33.1 g/dL (ref 30.0–36.0)
MCV: 94.2 fl (ref 78.0–100.0)
MONO ABS: 0.5 10*3/uL (ref 0.1–1.0)
Monocytes Relative: 10.4 % (ref 3.0–12.0)
Neutro Abs: 2.9 10*3/uL (ref 1.4–7.7)
Neutrophils Relative %: 60.9 % (ref 43.0–77.0)
Platelets: 205 10*3/uL (ref 150.0–400.0)
RBC: 4.49 Mil/uL (ref 3.87–5.11)
RDW: 12.8 % (ref 11.5–15.5)
WBC: 4.7 10*3/uL (ref 4.0–10.5)

## 2016-11-11 LAB — VITAMIN D 25 HYDROXY (VIT D DEFICIENCY, FRACTURES): VITD: 51 ng/mL (ref 30.00–100.00)

## 2016-11-11 LAB — LIPID PANEL
CHOL/HDL RATIO: 3
Cholesterol: 216 mg/dL — ABNORMAL HIGH (ref 0–200)
HDL: 69.6 mg/dL (ref >39.00)
LDL Cholesterol: 134 mg/dL — ABNORMAL HIGH (ref 0–99)
NONHDL: 146.27
Triglycerides: 62 mg/dL (ref 0.0–149.0)
VLDL: 12.4 mg/dL (ref 0.0–40.0)

## 2016-11-11 LAB — HEPATIC FUNCTION PANEL
ALK PHOS: 39 U/L (ref 39–117)
ALT: 17 U/L (ref 0–35)
AST: 18 U/L (ref 0–37)
Albumin: 4.5 g/dL (ref 3.5–5.2)
BILIRUBIN DIRECT: 0.1 mg/dL (ref 0.0–0.3)
BILIRUBIN TOTAL: 0.6 mg/dL (ref 0.2–1.2)
Total Protein: 6.7 g/dL (ref 6.0–8.3)

## 2016-11-11 LAB — TSH: TSH: 0.94 u[IU]/mL (ref 0.35–4.50)

## 2016-11-11 NOTE — Progress Notes (Signed)
Pre visit review using our clinic review tool, if applicable. No additional management support is needed unless otherwise documented below in the visit note. 

## 2016-11-11 NOTE — Assessment & Plan Note (Signed)
Check labs, replete prn. 

## 2016-11-11 NOTE — Assessment & Plan Note (Signed)
Pt has gained 11 lbs.  Stressed need for healthy diet and regular exercise.  Check labs to risk stratify.  Will follow.

## 2016-11-11 NOTE — Progress Notes (Signed)
   Subjective:    Patient ID: Kathy Morris, female    DOB: [birth year], 46 y.o.   MRN: [id number]  HPI CPE- UTD on pap, mammo due this month.  Repeat colonoscopy due 2021.  UTD on Tdap.  Due for flu today.  No concerns today.   Review of Systems Patient reports no vision/ hearing changes, adenopathy,fever, weight change,  persistant/recurrent hoarseness , swallowing issues, chest pain, palpitations, edema, persistant/recurrent cough, hemoptysis, dyspnea (rest/exertional/paroxysmal nocturnal), gastrointestinal bleeding (melena, rectal bleeding), abdominal pain, significant heartburn, bowel changes, GU symptoms (dysuria, hematuria, incontinence), Gyn symptoms (abnormal  bleeding, pain),  syncope, focal weakness, memory loss, numbness & tingling, skin/hair/nail changes, abnormal bruising or bleeding, anxiety, or depression.     Objective:   Physical Exam General Appearance:    Alert, cooperative, no distress, appears stated age  Head:    Normocephalic, without obvious abnormality, atraumatic  Eyes:    PERRL, conjunctiva/corneas clear, EOM's intact, fundi    benign, both eyes  Ears:    Normal TM's and external ear canals, both ears  Nose:   Nares normal, septum midline, mucosa normal, no drainage    or sinus tenderness  Throat:   Lips, mucosa, and tongue normal; teeth and gums normal  Neck:   Supple, symmetrical, trachea midline, no adenopathy;    Thyroid: no enlargement/tenderness/nodules  Back:     Symmetric, no curvature, ROM normal, no CVA tenderness  Lungs:     Clear to auscultation bilaterally, respirations unlabored  Chest Wall:    No tenderness or deformity   Heart:    Regular rate and rhythm, S1 and S2 normal, no murmur, rub   or gallop  Breast Exam:    Deferred to mammo  Abdomen:     Soft, non-tender, bowel sounds active all four quadrants,    no masses, no organomegaly  Genitalia:    Deferred to GYN  Rectal:    Extremities:   Extremities normal, atraumatic, no cyanosis or  edema  Pulses:   2+ and symmetric all extremities  Skin:   Skin color, texture, turgor normal, no rashes or lesions  Lymph nodes:   Cervical, supraclavicular, and axillary nodes normal  Neurologic:   CNII-XII intact, normal strength, sensation and reflexes    throughout          Assessment & Plan:

## 2016-11-11 NOTE — Patient Instructions (Signed)
Follow up in 1 year or as needed We'll notify you of your lab results and make any changes if needed Schedule your mammogram at your convenience Continue to work on healthy diet and regular exercise- you can do it!!! Call with any questions or concerns Have a great fall!!!

## 2016-11-11 NOTE — Assessment & Plan Note (Signed)
Pt's PE WNL w/ exception of weight gain.  UTD on pap, due for mammo this month.  UTD on colonoscopy.  Check labs.  Flu shot given today.

## 2016-11-12 ENCOUNTER — Other Ambulatory Visit: Payer: Self-pay | Admitting: Family Medicine

## 2016-11-12 ENCOUNTER — Encounter: Payer: Self-pay | Admitting: General Practice

## 2016-11-12 DIAGNOSIS — Z1231 Encounter for screening mammogram for malignant neoplasm of breast: Secondary | ICD-10-CM

## 2016-11-25 ENCOUNTER — Ambulatory Visit
Admission: RE | Admit: 2016-11-25 | Discharge: 2016-11-25 | Disposition: A | Discharge status: Home / Self Care | Payer: PRIVATE HEALTH INSURANCE | Source: Ambulatory Visit | Attending: Family Medicine | Admitting: Family Medicine

## 2016-11-25 DIAGNOSIS — Z1231 Encounter for screening mammogram for malignant neoplasm of breast: Secondary | ICD-10-CM

## 2017-11-11 ENCOUNTER — Other Ambulatory Visit: Payer: Self-pay | Admitting: Family Medicine

## 2017-11-11 DIAGNOSIS — Z1231 Encounter for screening mammogram for malignant neoplasm of breast: Secondary | ICD-10-CM

## 2017-11-12 ENCOUNTER — Ambulatory Visit (INDEPENDENT_AMBULATORY_CARE_PROVIDER_SITE_OTHER): Payer: PRIVATE HEALTH INSURANCE | Admitting: Family Medicine

## 2017-11-12 ENCOUNTER — Other Ambulatory Visit: Payer: Self-pay

## 2017-11-12 ENCOUNTER — Encounter: Payer: Self-pay | Admitting: Family Medicine

## 2017-11-12 VITALS — BP 122/82 | HR 76 | Temp 98.1°F | Resp 16 | Ht 64.0 in | Wt 195.5 lb

## 2017-11-12 DIAGNOSIS — E559 Vitamin D deficiency, unspecified: Secondary | ICD-10-CM

## 2017-11-12 DIAGNOSIS — Z0000 Encounter for general adult medical examination without abnormal findings: Secondary | ICD-10-CM | POA: Diagnosis not present

## 2017-11-12 DIAGNOSIS — E669 Obesity, unspecified: Secondary | ICD-10-CM | POA: Diagnosis not present

## 2017-11-12 DIAGNOSIS — Z23 Encounter for immunization: Secondary | ICD-10-CM

## 2017-11-12 LAB — LIPID PANEL
Cholesterol: 247 mg/dL — ABNORMAL HIGH (ref 0–200)
HDL: 76.9 mg/dL (ref >39.00)
LDL Cholesterol: 156 mg/dL — ABNORMAL HIGH (ref 0–99)
NONHDL: 169.91
Total CHOL/HDL Ratio: 3
Triglycerides: 71 mg/dL (ref 0.0–149.0)
VLDL: 14.2 mg/dL (ref 0.0–40.0)

## 2017-11-12 LAB — CBC WITH DIFFERENTIAL/PLATELET
BASOS ABS: 0 10*3/uL (ref 0.0–0.1)
BASOS PCT: 0.5 % (ref 0.0–3.0)
EOS PCT: 1.7 % (ref 0.0–5.0)
Eosinophils Absolute: 0.1 10*3/uL (ref 0.0–0.7)
HEMATOCRIT: 41.9 % (ref 36.0–46.0)
Hemoglobin: 14.4 g/dL (ref 12.0–15.0)
LYMPHS PCT: 30.6 % (ref 12.0–46.0)
Lymphs Abs: 1.7 10*3/uL (ref 0.7–4.0)
MCHC: 34.4 g/dL (ref 30.0–36.0)
MCV: 91.3 fl (ref 78.0–100.0)
MONOS PCT: 10.6 % (ref 3.0–12.0)
Monocytes Absolute: 0.6 10*3/uL (ref 0.1–1.0)
NEUTROS ABS: 3.2 10*3/uL (ref 1.4–7.7)
Neutrophils Relative %: 56.6 % (ref 43.0–77.0)
PLATELETS: 206 10*3/uL (ref 150.0–400.0)
RBC: 4.59 Mil/uL (ref 3.87–5.11)
RDW: 12.7 % (ref 11.5–15.5)
WBC: 5.6 10*3/uL (ref 4.0–10.5)

## 2017-11-12 LAB — BASIC METABOLIC PANEL
BUN: 22 mg/dL (ref 6–23)
CALCIUM: 8.8 mg/dL (ref 8.4–10.5)
CO2: 29 mEq/L (ref 19–32)
Chloride: 100 mEq/L (ref 96–112)
Creatinine, Ser: 0.78 mg/dL (ref 0.40–1.20)
GFR: 84.99 mL/min (ref >60.00)
Glucose, Bld: 90 mg/dL (ref 70–99)
Potassium: 4.5 mEq/L (ref 3.5–5.1)
SODIUM: 136 meq/L (ref 135–145)

## 2017-11-12 LAB — VITAMIN D 25 HYDROXY (VIT D DEFICIENCY, FRACTURES): VITD: 28.48 ng/mL — ABNORMAL LOW (ref 30.00–100.00)

## 2017-11-12 LAB — HEPATIC FUNCTION PANEL
ALT: 22 U/L (ref 0–35)
AST: 21 U/L (ref 0–37)
Albumin: 4.6 g/dL (ref 3.5–5.2)
Alkaline Phosphatase: 48 U/L (ref 39–117)
BILIRUBIN DIRECT: 0.1 mg/dL (ref 0.0–0.3)
Total Bilirubin: 0.5 mg/dL (ref 0.2–1.2)
Total Protein: 7.2 g/dL (ref 6.0–8.3)

## 2017-11-12 LAB — TSH: TSH: 1.42 u[IU]/mL (ref 0.35–4.50)

## 2017-11-12 MED ORDER — VITAMIN D (ERGOCALCIFEROL) 1.25 MG (50000 UNIT) PO CAPS: 50000.0000 [IU] | ORAL_CAPSULE | ORAL | 0 refills | Status: DC

## 2017-11-12 NOTE — Assessment & Plan Note (Signed)
Pt's PE WNL w/ exception of obesity.  UTD on pap, mammo.  Flu given.  Check labs.  Anticipatory guidance provided.

## 2017-11-12 NOTE — Assessment & Plan Note (Signed)
Ongoing issue for pt.  She has gained 12 lbs since last visit.  Check labs to risk stratify.  Will follow.

## 2017-11-12 NOTE — Patient Instructions (Signed)
Follow up in 1 year or as needed We'll notify you of your lab results and make any changes if needed Continue to work on healthy diet and regular exercise- you can do it! Call with any questions or concerns Happy Fall!! 

## 2017-11-12 NOTE — Assessment & Plan Note (Signed)
Pt has hx of this.  Check labs and replete prn. 

## 2017-11-12 NOTE — Progress Notes (Signed)
   Subjective:    Patient ID: Kathy Morris, female    DOB: May 15, 1970, 47 y.o.   MRN: [id number]  HPI CPE- UTD on mammo, pap, Tdap.  Will get flu today.  No regular exercise- pt has gained 12 lbs.     Review of Systems Patient reports no vision/ hearing changes, adenopathy,fever, weight change,  persistant/recurrent hoarseness , swallowing issues, chest pain, palpitations, edema, persistant/recurrent cough, hemoptysis, dyspnea (rest/exertional/paroxysmal nocturnal), gastrointestinal bleeding (melena, rectal bleeding), abdominal pain, significant heartburn, bowel changes, GU symptoms (dysuria, hematuria, incontinence), Gyn symptoms (abnormal  bleeding, pain),  syncope, focal weakness, memory loss, numbness & tingling, skin/hair/nail changes, abnormal bruising or bleeding, anxiety, or depression.     Objective:   Physical Exam General Appearance:    Alert, cooperative, no distress, appears stated age  Head:    Normocephalic, without obvious abnormality, atraumatic  Eyes:    PERRL, conjunctiva/corneas clear, EOM's intact, fundi    benign, both eyes  Ears:    Normal TM's and external ear canals, both ears  Nose:   Nares normal, septum midline, mucosa normal, no drainage    or sinus tenderness  Throat:   Lips, mucosa, and tongue normal; teeth and gums normal  Neck:   Supple, symmetrical, trachea midline, no adenopathy;    Thyroid: no enlargement/tenderness/nodules  Back:     Symmetric, no curvature, ROM normal, no CVA tenderness  Lungs:     Clear to auscultation bilaterally, respirations unlabored  Chest Wall:    No tenderness or deformity   Heart:    Regular rate and rhythm, S1 and S2 normal, no murmur, rub   or gallop  Breast Exam:    Deferred to GYN  Abdomen:     Soft, non-tender, bowel sounds active all four quadrants,    no masses, no organomegaly  Genitalia:    Deferred to GYN  Rectal:    Extremities:   Extremities normal, atraumatic, no cyanosis or edema  Pulses:   2+ and  symmetric all extremities  Skin:   Skin color, texture, turgor normal, no rashes or lesions  Lymph nodes:   Cervical, supraclavicular, and axillary nodes normal  Neurologic:   CNII-XII intact, normal strength, sensation and reflexes    throughout          Assessment & Plan:

## 2017-12-09 ENCOUNTER — Ambulatory Visit
Admission: RE | Admit: 2017-12-09 | Discharge: 2017-12-09 | Disposition: A | Discharge status: Home / Self Care | Payer: PRIVATE HEALTH INSURANCE | Source: Ambulatory Visit | Attending: Family Medicine | Admitting: Family Medicine

## 2017-12-09 DIAGNOSIS — Z1231 Encounter for screening mammogram for malignant neoplasm of breast: Secondary | ICD-10-CM

## 2018-11-18 ENCOUNTER — Encounter: Payer: PRIVATE HEALTH INSURANCE | Admitting: Family Medicine

## 2018-12-14 ENCOUNTER — Ambulatory Visit (INDEPENDENT_AMBULATORY_CARE_PROVIDER_SITE_OTHER): Payer: PRIVATE HEALTH INSURANCE | Admitting: Family Medicine

## 2018-12-14 ENCOUNTER — Other Ambulatory Visit: Payer: Self-pay

## 2018-12-14 ENCOUNTER — Encounter: Payer: Self-pay | Admitting: Family Medicine

## 2018-12-14 ENCOUNTER — Other Ambulatory Visit: Payer: Self-pay | Admitting: Family Medicine

## 2018-12-14 VITALS — BP 120/80 | HR 106 | Temp 97.8°F | Resp 16 | Ht 64.0 in | Wt 213.1 lb

## 2018-12-14 DIAGNOSIS — Z23 Encounter for immunization: Secondary | ICD-10-CM

## 2018-12-14 DIAGNOSIS — E669 Obesity, unspecified: Secondary | ICD-10-CM

## 2018-12-14 DIAGNOSIS — E559 Vitamin D deficiency, unspecified: Secondary | ICD-10-CM | POA: Diagnosis not present

## 2018-12-14 DIAGNOSIS — Z0000 Encounter for general adult medical examination without abnormal findings: Secondary | ICD-10-CM

## 2018-12-14 DIAGNOSIS — Z1231 Encounter for screening mammogram for malignant neoplasm of breast: Secondary | ICD-10-CM

## 2018-12-14 DIAGNOSIS — B372 Candidiasis of skin and nail: Secondary | ICD-10-CM | POA: Diagnosis not present

## 2018-12-14 LAB — CBC WITH DIFFERENTIAL/PLATELET
Basophils Absolute: 0 10*3/uL (ref 0.0–0.1)
Basophils Relative: 0.6 % (ref 0.0–3.0)
Eosinophils Absolute: 0.1 10*3/uL (ref 0.0–0.7)
Eosinophils Relative: 1.9 % (ref 0.0–5.0)
HCT: 42.6 % (ref 36.0–46.0)
Hemoglobin: 14.2 g/dL (ref 12.0–15.0)
Lymphocytes Relative: 18.7 % (ref 12.0–46.0)
Lymphs Abs: 1 10*3/uL (ref 0.7–4.0)
MCHC: 33.4 g/dL (ref 30.0–36.0)
MCV: 93.2 fl (ref 78.0–100.0)
Monocytes Absolute: 0.5 10*3/uL (ref 0.1–1.0)
Monocytes Relative: 8.4 % (ref 3.0–12.0)
Neutro Abs: 3.9 10*3/uL (ref 1.4–7.7)
Neutrophils Relative %: 70.4 % (ref 43.0–77.0)
Platelets: 211 10*3/uL (ref 150.0–400.0)
RBC: 4.57 Mil/uL (ref 3.87–5.11)
RDW: 12.7 % (ref 11.5–15.5)
WBC: 5.6 10*3/uL (ref 4.0–10.5)

## 2018-12-14 LAB — BASIC METABOLIC PANEL
BUN: 22 mg/dL (ref 6–23)
CO2: 28 mEq/L (ref 19–32)
Calcium: 9.3 mg/dL (ref 8.4–10.5)
Chloride: 100 mEq/L (ref 96–112)
Creatinine, Ser: 0.74 mg/dL (ref 0.40–1.20)
GFR: 84.56 mL/min (ref >60.00)
Glucose, Bld: 93 mg/dL (ref 70–99)
Potassium: 4 mEq/L (ref 3.5–5.1)
Sodium: 136 mEq/L (ref 135–145)

## 2018-12-14 LAB — VITAMIN D 25 HYDROXY (VIT D DEFICIENCY, FRACTURES): VITD: 27.49 ng/mL — ABNORMAL LOW (ref 30.00–100.00)

## 2018-12-14 LAB — HEPATIC FUNCTION PANEL
ALT: 32 U/L (ref 0–35)
AST: 20 U/L (ref 0–37)
Albumin: 4.7 g/dL (ref 3.5–5.2)
Alkaline Phosphatase: 70 U/L (ref 39–117)
Bilirubin, Direct: 0.1 mg/dL (ref 0.0–0.3)
Total Bilirubin: 0.6 mg/dL (ref 0.2–1.2)
Total Protein: 7.3 g/dL (ref 6.0–8.3)

## 2018-12-14 LAB — LIPID PANEL
Cholesterol: 244 mg/dL — ABNORMAL HIGH (ref 0–200)
HDL: 70.7 mg/dL (ref >39.00)
LDL Cholesterol: 157 mg/dL — ABNORMAL HIGH (ref 0–99)
NonHDL: 172.85
Total CHOL/HDL Ratio: 3
Triglycerides: 79 mg/dL (ref 0.0–149.0)
VLDL: 15.8 mg/dL (ref 0.0–40.0)

## 2018-12-14 LAB — TSH: TSH: 0.91 u[IU]/mL (ref 0.35–4.50)

## 2018-12-14 MED ORDER — CLOTRIMAZOLE-BETAMETHASONE 1-0.05 % EX CREA: 1.0000 "application " | TOPICAL_CREAM | Freq: Two times a day (BID) | CUTANEOUS | 1 refills | Status: DC

## 2018-12-14 NOTE — Assessment & Plan Note (Signed)
Deteriorated.  Pt has gained 17 lbs since last visit.  Stressed need for healthy diet and regular exercise.  Check labs to risk stratify.  Will follow. 

## 2018-12-14 NOTE — Assessment & Plan Note (Signed)
New.  Lotrisone prn.

## 2018-12-14 NOTE — Assessment & Plan Note (Signed)
Pt's PE WNL w/ exception of obesity and fungal dermatitis.  Due for mammo- pt to schedule.  Due for pap but currently menstruating.  Flu shot given.  Check labs.  Anticipatory guidance provided.

## 2018-12-14 NOTE — Assessment & Plan Note (Signed)
Pt has hx of this.  Check labs.  Replete prn. 

## 2018-12-14 NOTE — Patient Instructions (Signed)
Schedule your pap at your convenience We'll notify you of your lab results and make any changes if needed Call and schedule your mammogram! Continue to work on healthy diet and regular exercise- you can do it! Apply the Lotrisone cream twice daily until rash improves Call with any questions or concerns Stay Safe!!!

## 2018-12-14 NOTE — Progress Notes (Signed)
   Subjective:    Patient ID: Kathy Morris, female    DOB: 11-14-1970, 48 y.o.   MRN: [id number]  HPI CPE- due for pap (deferred due to menses), mammo (pt to call), flu.  UTD on Tdap.  Pt has gained 17 lbs since last visit.     Review of Systems Patient reports no vision/ hearing changes, adenopathy, fever,  persistant/recurrent hoarseness , swallowing issues, chest pain, palpitations, edema, persistant/recurrent cough, hemoptysis, dyspnea (rest/exertional/paroxysmal nocturnal), gastrointestinal bleeding (melena, rectal bleeding), abdominal pain, significant heartburn, bowel changes, GU symptoms (dysuria, hematuria, incontinence), Gyn symptoms (abnormal  bleeding, pain),  syncope, focal weakness, memory loss, numbness & tingling, skin/hair/nail changes, abnormal bruising or bleeding, anxiety, or depression.     Objective:   Physical Exam General Appearance:    Alert, cooperative, no distress, appears stated age, obese  Head:    Normocephalic, without obvious abnormality, atraumatic  Eyes:    PERRL, conjunctiva/corneas clear, EOM's intact, fundi    benign, both eyes  Ears:    Normal TM's and external ear canals, both ears  Nose:   Deferred due to COVID  Throat:   Neck:   Supple, symmetrical, trachea midline, no adenopathy;    Thyroid: no enlargement/tenderness/nodules  Back:     Symmetric, no curvature, ROM normal, no CVA tenderness  Lungs:     Clear to auscultation bilaterally, respirations unlabored  Chest Wall:    No tenderness or deformity   Heart:    Regular rate and rhythm, S1 and S2 normal, no murmur, rub   or gallop  Breast Exam:    Deferred to mammo  Abdomen:     Soft, non-tender, bowel sounds active all four quadrants,    no masses, no organomegaly  Genitalia:    Deferred due to menses  Rectal:    Extremities:   Extremities normal, atraumatic, no cyanosis or edema  Pulses:   2+ and symmetric all extremities  Skin:   Skin color, texture, turgor normal, fungal dermatitis  under L breast  Lymph nodes:   Cervical, supraclavicular, and axillary nodes normal  Neurologic:   CNII-XII intact, normal strength, sensation and reflexes    throughout          Assessment & Plan:

## 2018-12-15 ENCOUNTER — Other Ambulatory Visit: Payer: Self-pay | Admitting: General Practice

## 2018-12-15 MED ORDER — VITAMIN D (ERGOCALCIFEROL) 1.25 MG (50000 UNIT) PO CAPS: 50000.0000 [IU] | ORAL_CAPSULE | ORAL | 0 refills | Status: DC

## 2018-12-15 MED ORDER — ROSUVASTATIN CALCIUM 10 MG PO TABS: 10.0000 mg | ORAL_TABLET | Freq: Every day | ORAL | 1 refills | Status: DC

## 2019-01-25 ENCOUNTER — Ambulatory Visit
Admission: RE | Admit: 2019-01-25 | Discharge: 2019-01-25 | Disposition: A | Discharge status: Home / Self Care | Payer: PRIVATE HEALTH INSURANCE | Source: Ambulatory Visit | Attending: Family Medicine | Admitting: Family Medicine

## 2019-01-25 ENCOUNTER — Other Ambulatory Visit: Payer: Self-pay

## 2019-01-25 DIAGNOSIS — Z1231 Encounter for screening mammogram for malignant neoplasm of breast: Secondary | ICD-10-CM

## 2019-04-21 ENCOUNTER — Other Ambulatory Visit: Payer: Self-pay | Admitting: Family Medicine

## 2019-05-26 ENCOUNTER — Ambulatory Visit: Payer: PRIVATE HEALTH INSURANCE | Attending: Internal Medicine

## 2019-05-26 DIAGNOSIS — Z23 Encounter for immunization: Secondary | ICD-10-CM

## 2019-05-26 NOTE — Progress Notes (Signed)
   Covid-19 Vaccination Clinic  Name:  Kathy Morris    MRN: [id number] DOB: September 14, 1970  05/26/2019  [name] was observed post Covid-19 immunization for 15 minutes without incident. She was provided with Vaccine Information Sheet and instruction to access the V-Safe system.   [name] was instructed to call 911 with any severe reactions post vaccine: Marland Kitchen Difficulty breathing  . Swelling of face and throat  . A fast heartbeat  . A bad rash all over body  . Dizziness and weakness   Immunizations Administered    Name Date Dose VIS Date Route   Pfizer COVID-19 Vaccine 05/26/2019  3:57 PM 0.3 mL 02/18/2019 Intramuscular   Manufacturer: [employer], [employer]   Lot: [id number]   NDC: [address]-5

## 2019-06-20 ENCOUNTER — Ambulatory Visit: Payer: PRIVATE HEALTH INSURANCE | Attending: Internal Medicine

## 2019-06-20 DIAGNOSIS — Z23 Encounter for immunization: Secondary | ICD-10-CM

## 2019-06-20 NOTE — Progress Notes (Signed)
   Covid-19 Vaccination Clinic  Name:  Kathy Morris    MRN: [id number] DOB: August 17, 1970  06/20/2019  Kathy Morris was observed post Covid-19 immunization for 15 minutes without incident. She was provided with Vaccine Information Sheet and instruction to access the V-Safe system.   Kathy Morris was instructed to call 911 with any severe reactions post vaccine: Marland Kitchen Difficulty breathing  . Swelling of face and throat  . A fast heartbeat  . A bad rash all over body  . Dizziness and weakness   Immunizations Administered    Name Date Dose VIS Date Route   Pfizer COVID-19 Vaccine 06/20/2019  4:07 PM 0.3 mL 02/18/2019 Intramuscular   Manufacturer: [employer], [employer]   Lot: [id number]   NDC: [address]-7

## 2019-12-31 IMAGING — MG DIGITAL SCREENING BILAT W/ TOMO W/ CAD
8 series · 8 of 24 positions shown · non-contrast
Comparison: Previous exam(s).

CLINICAL DATA: Screening.

EXAM:
DIGITAL SCREENING BILATERAL MAMMOGRAM WITH TOMO AND CAD

[R MLO synth-2D]
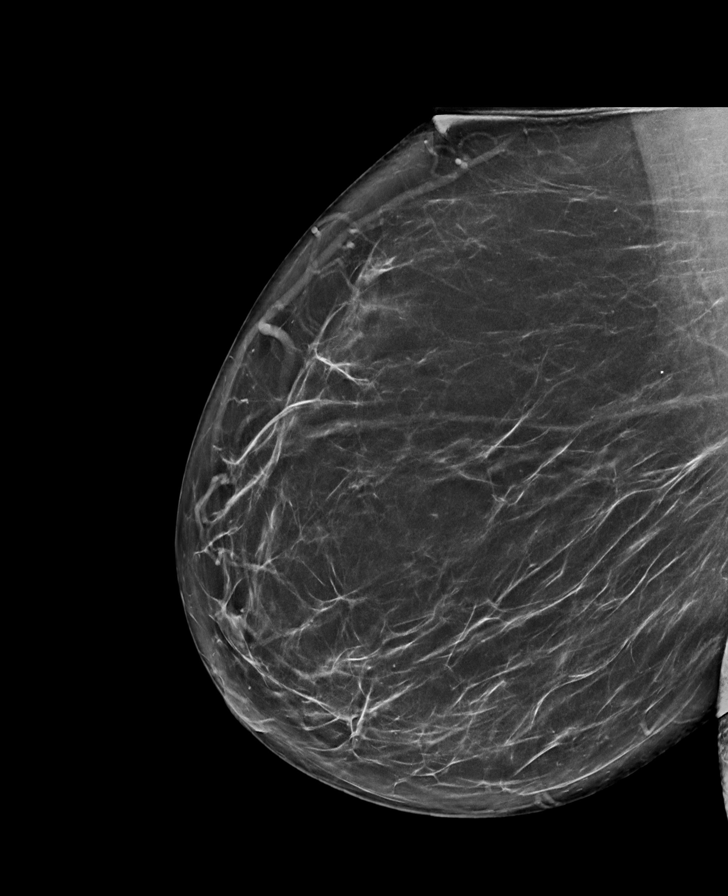

[L CC synth-2D]
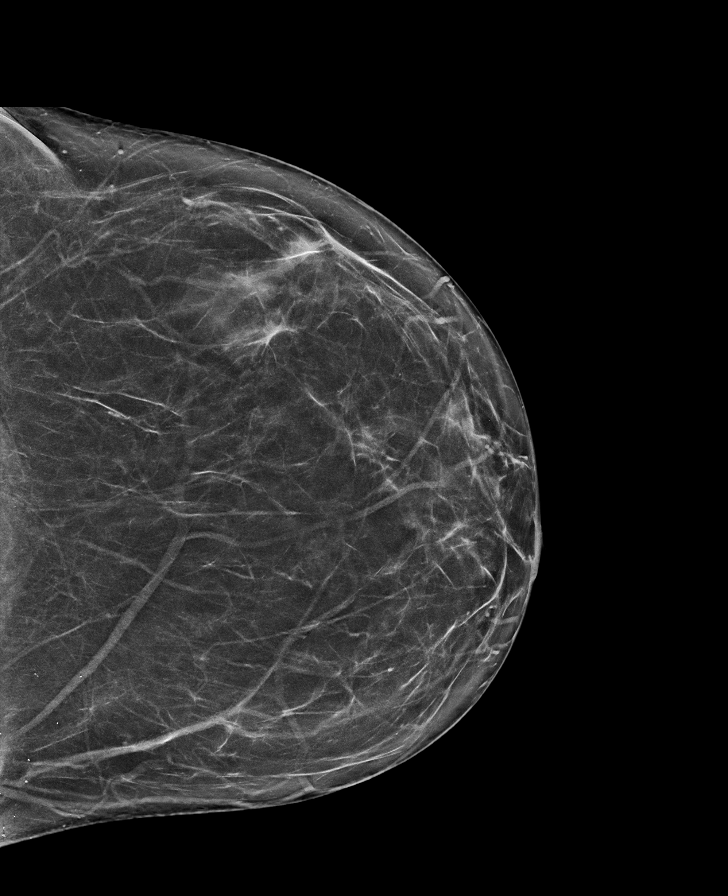

[L MLO synth-2D]
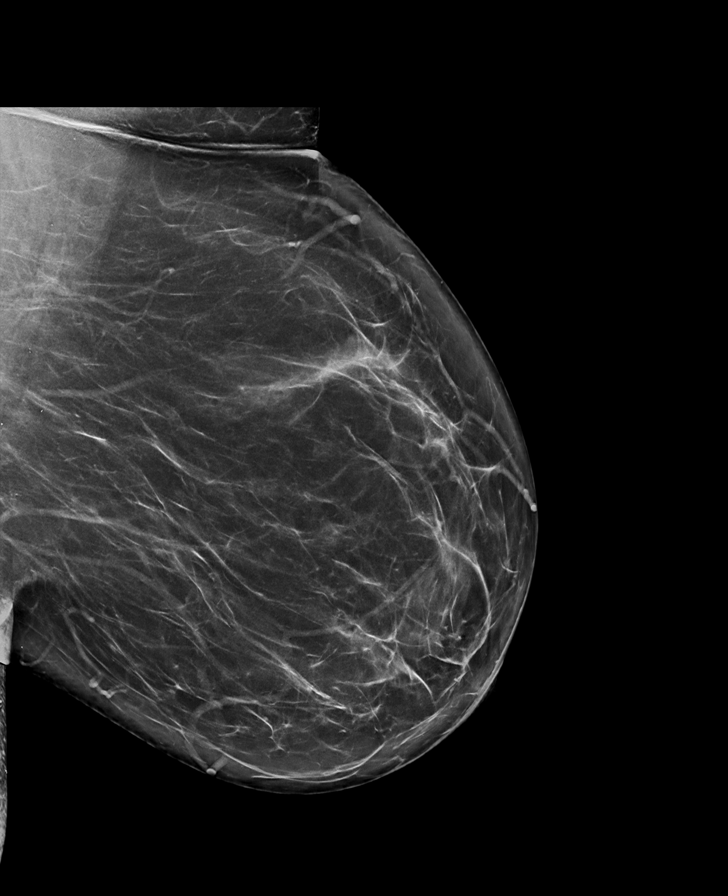

[R CC synth-2D]
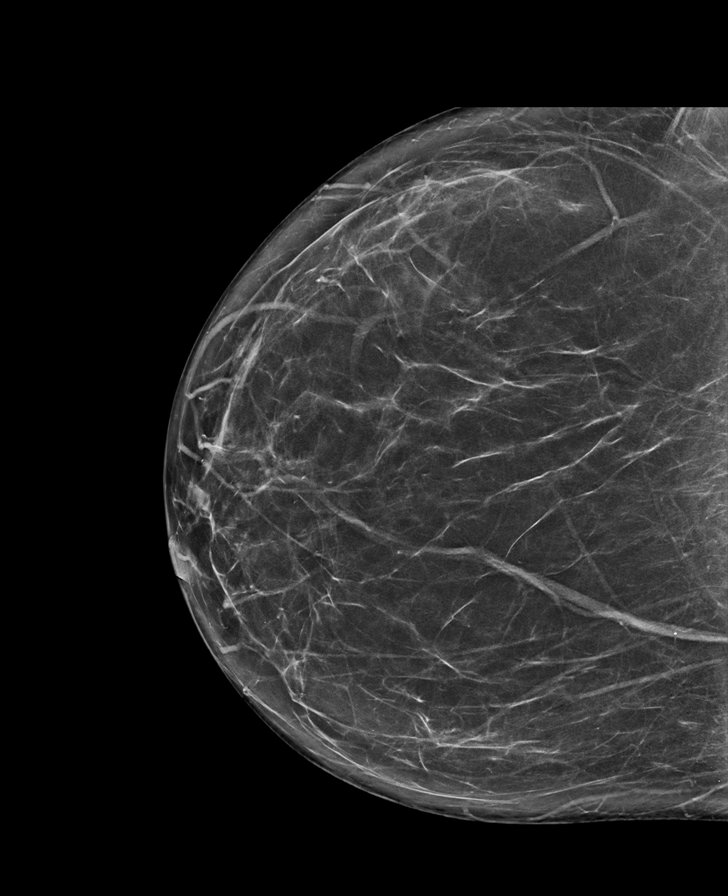

[L CC tomo · tomo slice 38/75.0]
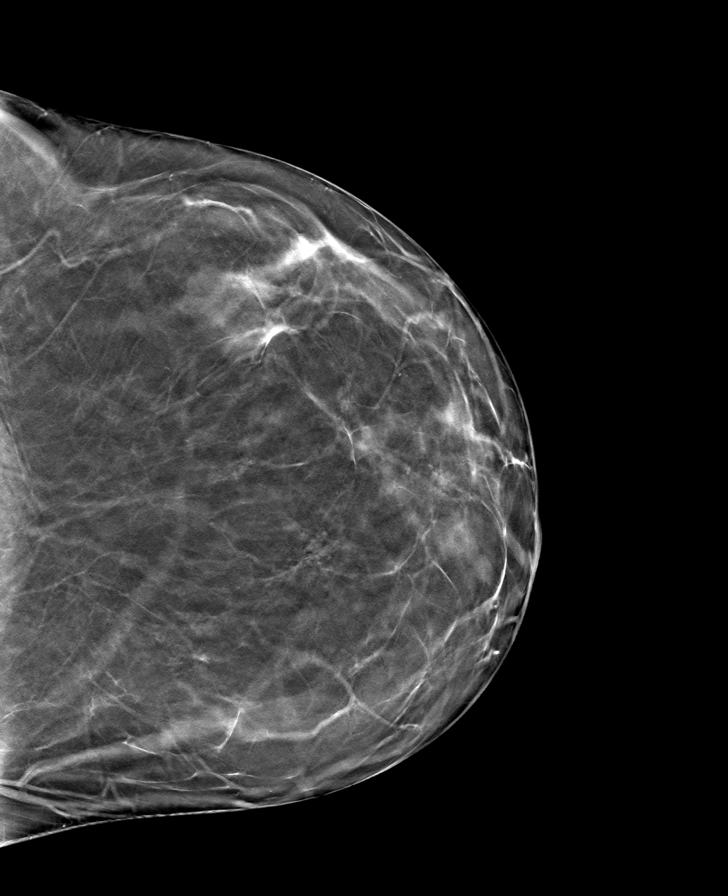

[R MLO tomo · tomo slice 45/88.0]
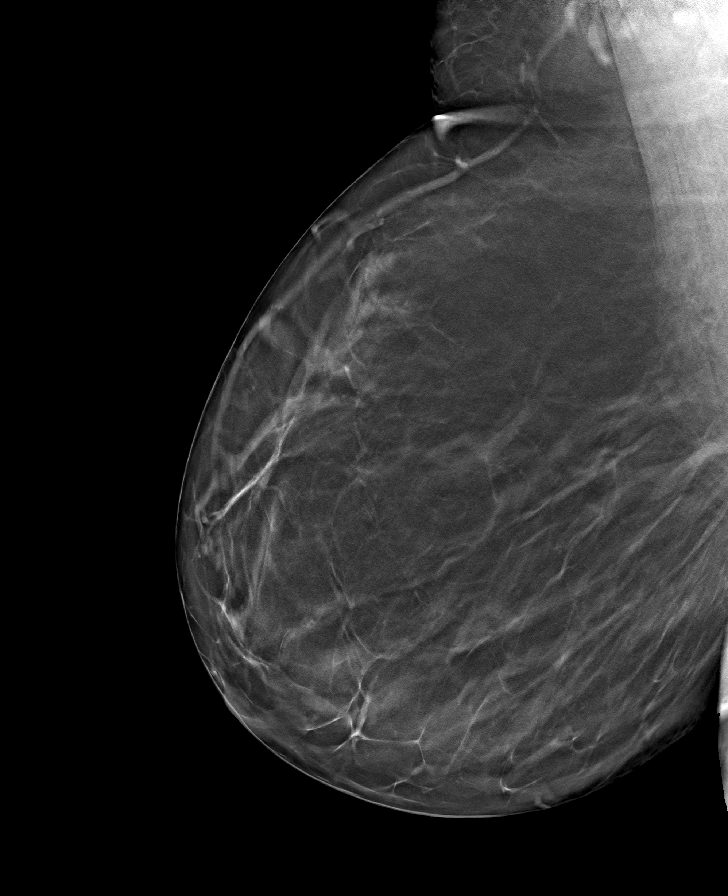

[R CC tomo · tomo slice 39/78.0]
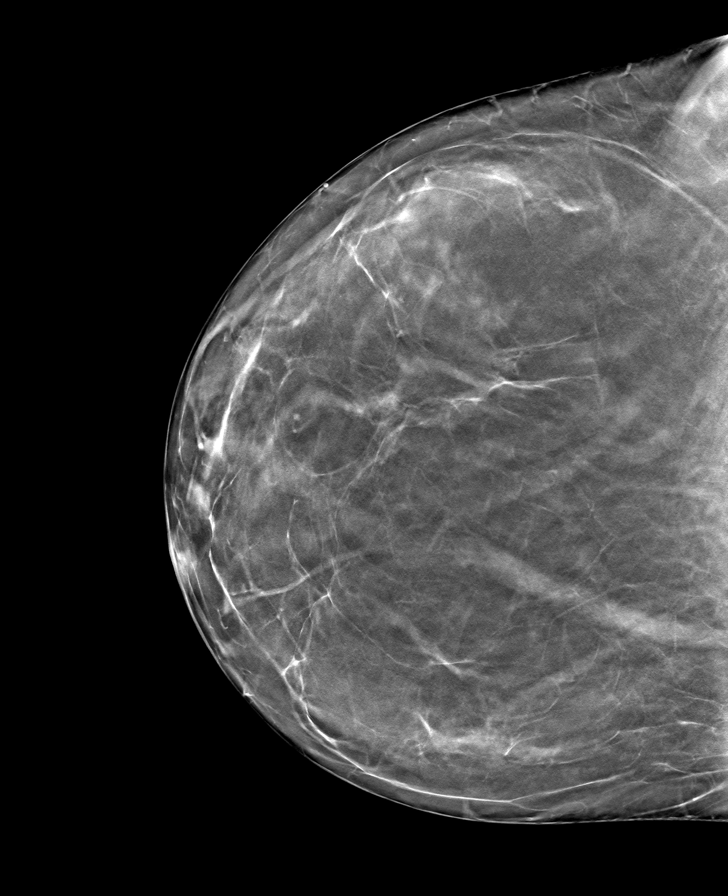

[L MLO tomo · tomo slice 45/88.0]
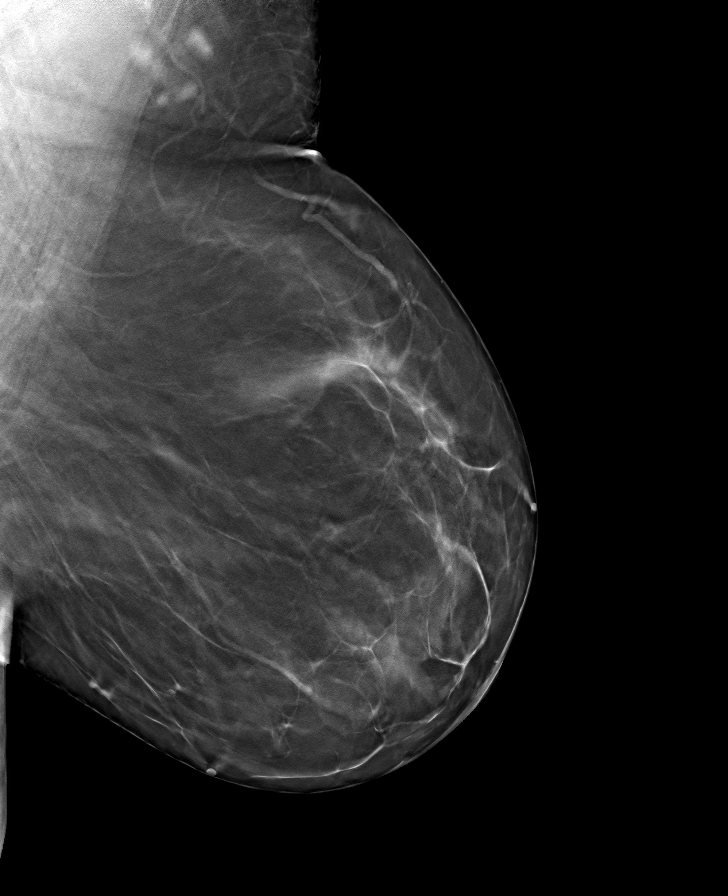

[8 of 24 positions shown; findings below may reference images not displayed]

ACR Breast Density Category b: There are scattered areas of
fibroglandular density.
FINDINGS: There are no findings suspicious for malignancy. Images were
processed with CAD.
IMPRESSION: No mammographic evidence of malignancy. A result letter of this
screening mammogram will be mailed directly to the patient.

RECOMMENDATION:
Screening mammogram in one year. (Code:[id number]-775)

BI-RADS CATEGORY  1: Negative.

## 2020-01-20 ENCOUNTER — Other Ambulatory Visit: Payer: Self-pay | Admitting: Family Medicine

## 2020-01-20 DIAGNOSIS — Z1231 Encounter for screening mammogram for malignant neoplasm of breast: Secondary | ICD-10-CM

## 2020-02-29 ENCOUNTER — Other Ambulatory Visit: Payer: Self-pay

## 2020-02-29 ENCOUNTER — Ambulatory Visit
Admission: RE | Admit: 2020-02-29 | Discharge: 2020-02-29 | Disposition: A | Discharge status: Home / Self Care | Payer: 59 | Source: Ambulatory Visit | Attending: Family Medicine | Admitting: Family Medicine

## 2020-02-29 DIAGNOSIS — Z1231 Encounter for screening mammogram for malignant neoplasm of breast: Secondary | ICD-10-CM

## 2020-09-05 ENCOUNTER — Encounter: Payer: Self-pay | Admitting: *Deleted

## 2020-09-27 ENCOUNTER — Encounter: Payer: Self-pay | Admitting: Gastroenterology

## 2020-11-20 ENCOUNTER — Encounter: Payer: Self-pay | Admitting: Gastroenterology

## 2021-02-07 ENCOUNTER — Other Ambulatory Visit: Payer: Self-pay | Admitting: Family Medicine

## 2021-02-07 DIAGNOSIS — Z1231 Encounter for screening mammogram for malignant neoplasm of breast: Secondary | ICD-10-CM

## 2021-03-14 ENCOUNTER — Ambulatory Visit
Admission: RE | Admit: 2021-03-14 | Discharge: 2021-03-14 | Disposition: A | Discharge status: Home / Self Care | Payer: 59 | Source: Ambulatory Visit | Attending: Family Medicine | Admitting: Family Medicine

## 2021-03-14 DIAGNOSIS — Z1231 Encounter for screening mammogram for malignant neoplasm of breast: Secondary | ICD-10-CM

## 2021-07-12 DIAGNOSIS — S8001XA Contusion of right knee, initial encounter: Secondary | ICD-10-CM | POA: Diagnosis not present

## 2021-07-19 DIAGNOSIS — S83241A Other tear of medial meniscus, current injury, right knee, initial encounter: Secondary | ICD-10-CM | POA: Diagnosis not present

## 2021-07-29 DIAGNOSIS — M25561 Pain in right knee: Secondary | ICD-10-CM | POA: Diagnosis not present

## 2021-07-31 DIAGNOSIS — S83511A Sprain of anterior cruciate ligament of right knee, initial encounter: Secondary | ICD-10-CM | POA: Diagnosis not present

## 2021-08-15 DIAGNOSIS — S83281A Other tear of lateral meniscus, current injury, right knee, initial encounter: Secondary | ICD-10-CM | POA: Diagnosis not present

## 2021-08-15 DIAGNOSIS — S83501A Sprain of unspecified cruciate ligament of right knee, initial encounter: Secondary | ICD-10-CM | POA: Diagnosis not present

## 2021-08-15 DIAGNOSIS — M948X6 Other specified disorders of cartilage, lower leg: Secondary | ICD-10-CM | POA: Diagnosis not present

## 2021-08-15 DIAGNOSIS — X58XXXA Exposure to other specified factors, initial encounter: Secondary | ICD-10-CM | POA: Diagnosis not present

## 2021-08-15 DIAGNOSIS — S83511A Sprain of anterior cruciate ligament of right knee, initial encounter: Secondary | ICD-10-CM | POA: Diagnosis not present

## 2021-08-15 DIAGNOSIS — G8918 Other acute postprocedural pain: Secondary | ICD-10-CM | POA: Diagnosis not present

## 2021-08-15 DIAGNOSIS — Y999 Unspecified external cause status: Secondary | ICD-10-CM | POA: Diagnosis not present

## 2021-08-26 DIAGNOSIS — S83511A Sprain of anterior cruciate ligament of right knee, initial encounter: Secondary | ICD-10-CM | POA: Diagnosis not present

## 2021-09-02 DIAGNOSIS — M23611 Other spontaneous disruption of anterior cruciate ligament of right knee: Secondary | ICD-10-CM | POA: Diagnosis not present

## 2021-09-02 DIAGNOSIS — M25561 Pain in right knee: Secondary | ICD-10-CM | POA: Diagnosis not present

## 2021-09-02 DIAGNOSIS — R2689 Other abnormalities of gait and mobility: Secondary | ICD-10-CM | POA: Diagnosis not present

## 2021-09-02 DIAGNOSIS — M25661 Stiffness of right knee, not elsewhere classified: Secondary | ICD-10-CM | POA: Diagnosis not present

## 2021-09-04 DIAGNOSIS — M23611 Other spontaneous disruption of anterior cruciate ligament of right knee: Secondary | ICD-10-CM | POA: Diagnosis not present

## 2021-09-04 DIAGNOSIS — M25561 Pain in right knee: Secondary | ICD-10-CM | POA: Diagnosis not present

## 2021-09-04 DIAGNOSIS — M25661 Stiffness of right knee, not elsewhere classified: Secondary | ICD-10-CM | POA: Diagnosis not present

## 2021-09-04 DIAGNOSIS — R2689 Other abnormalities of gait and mobility: Secondary | ICD-10-CM | POA: Diagnosis not present

## 2021-09-16 DIAGNOSIS — M25561 Pain in right knee: Secondary | ICD-10-CM | POA: Diagnosis not present

## 2021-09-16 DIAGNOSIS — R2689 Other abnormalities of gait and mobility: Secondary | ICD-10-CM | POA: Diagnosis not present

## 2021-09-16 DIAGNOSIS — M25661 Stiffness of right knee, not elsewhere classified: Secondary | ICD-10-CM | POA: Diagnosis not present

## 2021-09-16 DIAGNOSIS — M23611 Other spontaneous disruption of anterior cruciate ligament of right knee: Secondary | ICD-10-CM | POA: Diagnosis not present

## 2021-09-19 DIAGNOSIS — M23611 Other spontaneous disruption of anterior cruciate ligament of right knee: Secondary | ICD-10-CM | POA: Diagnosis not present

## 2021-09-19 DIAGNOSIS — M25661 Stiffness of right knee, not elsewhere classified: Secondary | ICD-10-CM | POA: Diagnosis not present

## 2021-09-19 DIAGNOSIS — M25561 Pain in right knee: Secondary | ICD-10-CM | POA: Diagnosis not present

## 2021-09-19 DIAGNOSIS — R2689 Other abnormalities of gait and mobility: Secondary | ICD-10-CM | POA: Diagnosis not present

## 2021-09-23 DIAGNOSIS — S83511D Sprain of anterior cruciate ligament of right knee, subsequent encounter: Secondary | ICD-10-CM | POA: Diagnosis not present

## 2021-09-24 DIAGNOSIS — M25561 Pain in right knee: Secondary | ICD-10-CM | POA: Diagnosis not present

## 2021-09-24 DIAGNOSIS — R2689 Other abnormalities of gait and mobility: Secondary | ICD-10-CM | POA: Diagnosis not present

## 2021-09-24 DIAGNOSIS — M25661 Stiffness of right knee, not elsewhere classified: Secondary | ICD-10-CM | POA: Diagnosis not present

## 2021-09-24 DIAGNOSIS — M23611 Other spontaneous disruption of anterior cruciate ligament of right knee: Secondary | ICD-10-CM | POA: Diagnosis not present

## 2021-09-26 DIAGNOSIS — M25561 Pain in right knee: Secondary | ICD-10-CM | POA: Diagnosis not present

## 2021-09-26 DIAGNOSIS — R2689 Other abnormalities of gait and mobility: Secondary | ICD-10-CM | POA: Diagnosis not present

## 2021-09-26 DIAGNOSIS — M25661 Stiffness of right knee, not elsewhere classified: Secondary | ICD-10-CM | POA: Diagnosis not present

## 2021-09-26 DIAGNOSIS — M23611 Other spontaneous disruption of anterior cruciate ligament of right knee: Secondary | ICD-10-CM | POA: Diagnosis not present

## 2021-09-30 DIAGNOSIS — R2689 Other abnormalities of gait and mobility: Secondary | ICD-10-CM | POA: Diagnosis not present

## 2021-09-30 DIAGNOSIS — M25561 Pain in right knee: Secondary | ICD-10-CM | POA: Diagnosis not present

## 2021-09-30 DIAGNOSIS — M25661 Stiffness of right knee, not elsewhere classified: Secondary | ICD-10-CM | POA: Diagnosis not present

## 2021-09-30 DIAGNOSIS — M23611 Other spontaneous disruption of anterior cruciate ligament of right knee: Secondary | ICD-10-CM | POA: Diagnosis not present

## 2021-10-03 DIAGNOSIS — M23611 Other spontaneous disruption of anterior cruciate ligament of right knee: Secondary | ICD-10-CM | POA: Diagnosis not present

## 2021-10-03 DIAGNOSIS — M25561 Pain in right knee: Secondary | ICD-10-CM | POA: Diagnosis not present

## 2021-10-03 DIAGNOSIS — M25661 Stiffness of right knee, not elsewhere classified: Secondary | ICD-10-CM | POA: Diagnosis not present

## 2021-10-03 DIAGNOSIS — R2689 Other abnormalities of gait and mobility: Secondary | ICD-10-CM | POA: Diagnosis not present

## 2021-10-08 DIAGNOSIS — M25661 Stiffness of right knee, not elsewhere classified: Secondary | ICD-10-CM | POA: Diagnosis not present

## 2021-10-08 DIAGNOSIS — R2689 Other abnormalities of gait and mobility: Secondary | ICD-10-CM | POA: Diagnosis not present

## 2021-10-08 DIAGNOSIS — M25561 Pain in right knee: Secondary | ICD-10-CM | POA: Diagnosis not present

## 2021-10-08 DIAGNOSIS — M23611 Other spontaneous disruption of anterior cruciate ligament of right knee: Secondary | ICD-10-CM | POA: Diagnosis not present

## 2021-10-10 DIAGNOSIS — M25561 Pain in right knee: Secondary | ICD-10-CM | POA: Diagnosis not present

## 2021-10-10 DIAGNOSIS — R2689 Other abnormalities of gait and mobility: Secondary | ICD-10-CM | POA: Diagnosis not present

## 2021-10-10 DIAGNOSIS — M23611 Other spontaneous disruption of anterior cruciate ligament of right knee: Secondary | ICD-10-CM | POA: Diagnosis not present

## 2021-10-10 DIAGNOSIS — M25661 Stiffness of right knee, not elsewhere classified: Secondary | ICD-10-CM | POA: Diagnosis not present

## 2021-10-14 DIAGNOSIS — R2689 Other abnormalities of gait and mobility: Secondary | ICD-10-CM | POA: Diagnosis not present

## 2021-10-14 DIAGNOSIS — M25561 Pain in right knee: Secondary | ICD-10-CM | POA: Diagnosis not present

## 2021-10-14 DIAGNOSIS — M25661 Stiffness of right knee, not elsewhere classified: Secondary | ICD-10-CM | POA: Diagnosis not present

## 2021-10-14 DIAGNOSIS — M23611 Other spontaneous disruption of anterior cruciate ligament of right knee: Secondary | ICD-10-CM | POA: Diagnosis not present

## 2021-10-17 DIAGNOSIS — R2689 Other abnormalities of gait and mobility: Secondary | ICD-10-CM | POA: Diagnosis not present

## 2021-10-17 DIAGNOSIS — M23611 Other spontaneous disruption of anterior cruciate ligament of right knee: Secondary | ICD-10-CM | POA: Diagnosis not present

## 2021-10-17 DIAGNOSIS — M25661 Stiffness of right knee, not elsewhere classified: Secondary | ICD-10-CM | POA: Diagnosis not present

## 2021-10-17 DIAGNOSIS — M25561 Pain in right knee: Secondary | ICD-10-CM | POA: Diagnosis not present

## 2021-10-22 DIAGNOSIS — M23611 Other spontaneous disruption of anterior cruciate ligament of right knee: Secondary | ICD-10-CM | POA: Diagnosis not present

## 2021-10-22 DIAGNOSIS — M25661 Stiffness of right knee, not elsewhere classified: Secondary | ICD-10-CM | POA: Diagnosis not present

## 2021-10-22 DIAGNOSIS — R2689 Other abnormalities of gait and mobility: Secondary | ICD-10-CM | POA: Diagnosis not present

## 2021-10-22 DIAGNOSIS — M25561 Pain in right knee: Secondary | ICD-10-CM | POA: Diagnosis not present

## 2021-10-25 DIAGNOSIS — M25661 Stiffness of right knee, not elsewhere classified: Secondary | ICD-10-CM | POA: Diagnosis not present

## 2021-10-25 DIAGNOSIS — M25561 Pain in right knee: Secondary | ICD-10-CM | POA: Diagnosis not present

## 2021-10-25 DIAGNOSIS — R2689 Other abnormalities of gait and mobility: Secondary | ICD-10-CM | POA: Diagnosis not present

## 2021-10-25 DIAGNOSIS — M23611 Other spontaneous disruption of anterior cruciate ligament of right knee: Secondary | ICD-10-CM | POA: Diagnosis not present

## 2021-10-28 DIAGNOSIS — M23611 Other spontaneous disruption of anterior cruciate ligament of right knee: Secondary | ICD-10-CM | POA: Diagnosis not present

## 2021-10-28 DIAGNOSIS — M25661 Stiffness of right knee, not elsewhere classified: Secondary | ICD-10-CM | POA: Diagnosis not present

## 2021-10-28 DIAGNOSIS — M25561 Pain in right knee: Secondary | ICD-10-CM | POA: Diagnosis not present

## 2021-10-28 DIAGNOSIS — R2689 Other abnormalities of gait and mobility: Secondary | ICD-10-CM | POA: Diagnosis not present

## 2021-11-04 DIAGNOSIS — M25561 Pain in right knee: Secondary | ICD-10-CM | POA: Diagnosis not present

## 2021-11-04 DIAGNOSIS — R2689 Other abnormalities of gait and mobility: Secondary | ICD-10-CM | POA: Diagnosis not present

## 2021-11-04 DIAGNOSIS — M25661 Stiffness of right knee, not elsewhere classified: Secondary | ICD-10-CM | POA: Diagnosis not present

## 2021-11-04 DIAGNOSIS — M23611 Other spontaneous disruption of anterior cruciate ligament of right knee: Secondary | ICD-10-CM | POA: Diagnosis not present

## 2021-11-06 DIAGNOSIS — M23611 Other spontaneous disruption of anterior cruciate ligament of right knee: Secondary | ICD-10-CM | POA: Diagnosis not present

## 2021-11-06 DIAGNOSIS — R2689 Other abnormalities of gait and mobility: Secondary | ICD-10-CM | POA: Diagnosis not present

## 2021-11-06 DIAGNOSIS — M25661 Stiffness of right knee, not elsewhere classified: Secondary | ICD-10-CM | POA: Diagnosis not present

## 2021-11-06 DIAGNOSIS — M25561 Pain in right knee: Secondary | ICD-10-CM | POA: Diagnosis not present

## 2021-11-18 DIAGNOSIS — M23611 Other spontaneous disruption of anterior cruciate ligament of right knee: Secondary | ICD-10-CM | POA: Diagnosis not present

## 2021-11-18 DIAGNOSIS — M25661 Stiffness of right knee, not elsewhere classified: Secondary | ICD-10-CM | POA: Diagnosis not present

## 2021-11-18 DIAGNOSIS — M25561 Pain in right knee: Secondary | ICD-10-CM | POA: Diagnosis not present

## 2021-11-18 DIAGNOSIS — R2689 Other abnormalities of gait and mobility: Secondary | ICD-10-CM | POA: Diagnosis not present

## 2021-11-21 DIAGNOSIS — M25661 Stiffness of right knee, not elsewhere classified: Secondary | ICD-10-CM | POA: Diagnosis not present

## 2021-11-21 DIAGNOSIS — R2689 Other abnormalities of gait and mobility: Secondary | ICD-10-CM | POA: Diagnosis not present

## 2021-11-21 DIAGNOSIS — M25561 Pain in right knee: Secondary | ICD-10-CM | POA: Diagnosis not present

## 2021-11-21 DIAGNOSIS — M23611 Other spontaneous disruption of anterior cruciate ligament of right knee: Secondary | ICD-10-CM | POA: Diagnosis not present

## 2021-11-25 DIAGNOSIS — M25661 Stiffness of right knee, not elsewhere classified: Secondary | ICD-10-CM | POA: Diagnosis not present

## 2021-11-25 DIAGNOSIS — M23611 Other spontaneous disruption of anterior cruciate ligament of right knee: Secondary | ICD-10-CM | POA: Diagnosis not present

## 2021-11-25 DIAGNOSIS — M25561 Pain in right knee: Secondary | ICD-10-CM | POA: Diagnosis not present

## 2021-11-25 DIAGNOSIS — R2689 Other abnormalities of gait and mobility: Secondary | ICD-10-CM | POA: Diagnosis not present

## 2021-11-28 DIAGNOSIS — M25661 Stiffness of right knee, not elsewhere classified: Secondary | ICD-10-CM | POA: Diagnosis not present

## 2021-11-28 DIAGNOSIS — R2689 Other abnormalities of gait and mobility: Secondary | ICD-10-CM | POA: Diagnosis not present

## 2021-11-28 DIAGNOSIS — M25561 Pain in right knee: Secondary | ICD-10-CM | POA: Diagnosis not present

## 2021-11-28 DIAGNOSIS — M23611 Other spontaneous disruption of anterior cruciate ligament of right knee: Secondary | ICD-10-CM | POA: Diagnosis not present

## 2021-12-02 DIAGNOSIS — M23611 Other spontaneous disruption of anterior cruciate ligament of right knee: Secondary | ICD-10-CM | POA: Diagnosis not present

## 2021-12-02 DIAGNOSIS — R2689 Other abnormalities of gait and mobility: Secondary | ICD-10-CM | POA: Diagnosis not present

## 2021-12-02 DIAGNOSIS — M25661 Stiffness of right knee, not elsewhere classified: Secondary | ICD-10-CM | POA: Diagnosis not present

## 2021-12-02 DIAGNOSIS — M25561 Pain in right knee: Secondary | ICD-10-CM | POA: Diagnosis not present

## 2021-12-02 DIAGNOSIS — S83511D Sprain of anterior cruciate ligament of right knee, subsequent encounter: Secondary | ICD-10-CM | POA: Diagnosis not present

## 2021-12-05 DIAGNOSIS — M25661 Stiffness of right knee, not elsewhere classified: Secondary | ICD-10-CM | POA: Diagnosis not present

## 2021-12-05 DIAGNOSIS — M25561 Pain in right knee: Secondary | ICD-10-CM | POA: Diagnosis not present

## 2021-12-05 DIAGNOSIS — M23611 Other spontaneous disruption of anterior cruciate ligament of right knee: Secondary | ICD-10-CM | POA: Diagnosis not present

## 2021-12-05 DIAGNOSIS — R2689 Other abnormalities of gait and mobility: Secondary | ICD-10-CM | POA: Diagnosis not present

## 2021-12-09 DIAGNOSIS — M25561 Pain in right knee: Secondary | ICD-10-CM | POA: Diagnosis not present

## 2021-12-09 DIAGNOSIS — R2689 Other abnormalities of gait and mobility: Secondary | ICD-10-CM | POA: Diagnosis not present

## 2021-12-09 DIAGNOSIS — M23611 Other spontaneous disruption of anterior cruciate ligament of right knee: Secondary | ICD-10-CM | POA: Diagnosis not present

## 2021-12-09 DIAGNOSIS — M25661 Stiffness of right knee, not elsewhere classified: Secondary | ICD-10-CM | POA: Diagnosis not present

## 2021-12-12 DIAGNOSIS — M23611 Other spontaneous disruption of anterior cruciate ligament of right knee: Secondary | ICD-10-CM | POA: Diagnosis not present

## 2021-12-12 DIAGNOSIS — R2689 Other abnormalities of gait and mobility: Secondary | ICD-10-CM | POA: Diagnosis not present

## 2021-12-12 DIAGNOSIS — M25661 Stiffness of right knee, not elsewhere classified: Secondary | ICD-10-CM | POA: Diagnosis not present

## 2021-12-12 DIAGNOSIS — M25561 Pain in right knee: Secondary | ICD-10-CM | POA: Diagnosis not present

## 2021-12-17 DIAGNOSIS — M25661 Stiffness of right knee, not elsewhere classified: Secondary | ICD-10-CM | POA: Diagnosis not present

## 2021-12-17 DIAGNOSIS — R2689 Other abnormalities of gait and mobility: Secondary | ICD-10-CM | POA: Diagnosis not present

## 2021-12-17 DIAGNOSIS — M23611 Other spontaneous disruption of anterior cruciate ligament of right knee: Secondary | ICD-10-CM | POA: Diagnosis not present

## 2021-12-17 DIAGNOSIS — M25561 Pain in right knee: Secondary | ICD-10-CM | POA: Diagnosis not present

## 2021-12-19 DIAGNOSIS — M23611 Other spontaneous disruption of anterior cruciate ligament of right knee: Secondary | ICD-10-CM | POA: Diagnosis not present

## 2021-12-19 DIAGNOSIS — M25561 Pain in right knee: Secondary | ICD-10-CM | POA: Diagnosis not present

## 2021-12-19 DIAGNOSIS — R2689 Other abnormalities of gait and mobility: Secondary | ICD-10-CM | POA: Diagnosis not present

## 2021-12-19 DIAGNOSIS — M25661 Stiffness of right knee, not elsewhere classified: Secondary | ICD-10-CM | POA: Diagnosis not present

## 2021-12-24 DIAGNOSIS — M25661 Stiffness of right knee, not elsewhere classified: Secondary | ICD-10-CM | POA: Diagnosis not present

## 2021-12-24 DIAGNOSIS — M25561 Pain in right knee: Secondary | ICD-10-CM | POA: Diagnosis not present

## 2021-12-24 DIAGNOSIS — R2689 Other abnormalities of gait and mobility: Secondary | ICD-10-CM | POA: Diagnosis not present

## 2021-12-24 DIAGNOSIS — M23611 Other spontaneous disruption of anterior cruciate ligament of right knee: Secondary | ICD-10-CM | POA: Diagnosis not present

## 2022-01-01 DIAGNOSIS — M25661 Stiffness of right knee, not elsewhere classified: Secondary | ICD-10-CM | POA: Diagnosis not present

## 2022-01-01 DIAGNOSIS — M23611 Other spontaneous disruption of anterior cruciate ligament of right knee: Secondary | ICD-10-CM | POA: Diagnosis not present

## 2022-01-01 DIAGNOSIS — M25561 Pain in right knee: Secondary | ICD-10-CM | POA: Diagnosis not present

## 2022-01-01 DIAGNOSIS — R2689 Other abnormalities of gait and mobility: Secondary | ICD-10-CM | POA: Diagnosis not present

## 2022-01-03 DIAGNOSIS — M25561 Pain in right knee: Secondary | ICD-10-CM | POA: Diagnosis not present

## 2022-01-03 DIAGNOSIS — M23611 Other spontaneous disruption of anterior cruciate ligament of right knee: Secondary | ICD-10-CM | POA: Diagnosis not present

## 2022-01-03 DIAGNOSIS — M25661 Stiffness of right knee, not elsewhere classified: Secondary | ICD-10-CM | POA: Diagnosis not present

## 2022-01-03 DIAGNOSIS — R2689 Other abnormalities of gait and mobility: Secondary | ICD-10-CM | POA: Diagnosis not present

## 2022-01-07 DIAGNOSIS — M25561 Pain in right knee: Secondary | ICD-10-CM | POA: Diagnosis not present

## 2022-01-07 DIAGNOSIS — M23611 Other spontaneous disruption of anterior cruciate ligament of right knee: Secondary | ICD-10-CM | POA: Diagnosis not present

## 2022-01-07 DIAGNOSIS — M25661 Stiffness of right knee, not elsewhere classified: Secondary | ICD-10-CM | POA: Diagnosis not present

## 2022-01-07 DIAGNOSIS — R2689 Other abnormalities of gait and mobility: Secondary | ICD-10-CM | POA: Diagnosis not present

## 2022-01-09 DIAGNOSIS — M25661 Stiffness of right knee, not elsewhere classified: Secondary | ICD-10-CM | POA: Diagnosis not present

## 2022-01-09 DIAGNOSIS — R2689 Other abnormalities of gait and mobility: Secondary | ICD-10-CM | POA: Diagnosis not present

## 2022-01-09 DIAGNOSIS — M23611 Other spontaneous disruption of anterior cruciate ligament of right knee: Secondary | ICD-10-CM | POA: Diagnosis not present

## 2022-01-09 DIAGNOSIS — M25561 Pain in right knee: Secondary | ICD-10-CM | POA: Diagnosis not present

## 2022-02-10 ENCOUNTER — Other Ambulatory Visit: Payer: Self-pay | Admitting: Family Medicine

## 2022-02-10 DIAGNOSIS — Z1231 Encounter for screening mammogram for malignant neoplasm of breast: Secondary | ICD-10-CM

## 2022-02-24 DIAGNOSIS — S83511D Sprain of anterior cruciate ligament of right knee, subsequent encounter: Secondary | ICD-10-CM | POA: Diagnosis not present

## 2022-04-08 ENCOUNTER — Ambulatory Visit
Admission: RE | Admit: 2022-04-08 | Discharge: 2022-04-08 | Disposition: A | Discharge status: Home / Self Care | Payer: BC Managed Care – PPO | Source: Ambulatory Visit | Attending: Family Medicine | Admitting: Family Medicine

## 2022-04-08 DIAGNOSIS — Z1231 Encounter for screening mammogram for malignant neoplasm of breast: Secondary | ICD-10-CM

## 2022-05-16 ENCOUNTER — Ambulatory Visit: Payer: BC Managed Care – PPO | Admitting: Family Medicine

## 2022-05-16 ENCOUNTER — Encounter: Payer: Self-pay | Admitting: Family Medicine

## 2022-05-16 VITALS — BP 160/112 | Temp 97.9°F | Resp 18 | Ht 64.0 in | Wt 268.5 lb

## 2022-05-16 DIAGNOSIS — I10 Essential (primary) hypertension: Secondary | ICD-10-CM | POA: Diagnosis not present

## 2022-05-16 LAB — CBC WITH DIFFERENTIAL/PLATELET
Basophils Absolute: 0 10*3/uL (ref 0.0–0.1)
Basophils Relative: 0.5 % (ref 0.0–3.0)
Eosinophils Absolute: 0.1 10*3/uL (ref 0.0–0.7)
Eosinophils Relative: 1.2 % (ref 0.0–5.0)
HCT: 45.7 % (ref 36.0–46.0)
Hemoglobin: 15.4 g/dL — ABNORMAL HIGH (ref 12.0–15.0)
Lymphocytes Relative: 16.7 % (ref 12.0–46.0)
Lymphs Abs: 1.3 10*3/uL (ref 0.7–4.0)
MCHC: 33.7 g/dL (ref 30.0–36.0)
MCV: 94.1 fl (ref 78.0–100.0)
Monocytes Absolute: 0.6 10*3/uL (ref 0.1–1.0)
Monocytes Relative: 8.4 % (ref 3.0–12.0)
Neutro Abs: 5.6 10*3/uL (ref 1.4–7.7)
Neutrophils Relative %: 73.2 % (ref 43.0–77.0)
Platelets: 211 10*3/uL (ref 150.0–400.0)
RBC: 4.86 Mil/uL (ref 3.87–5.11)
RDW: 12.9 % (ref 11.5–15.5)
WBC: 7.6 10*3/uL (ref 4.0–10.5)

## 2022-05-16 LAB — HEPATIC FUNCTION PANEL
ALT: 77 U/L — ABNORMAL HIGH (ref 0–35)
AST: 50 U/L — ABNORMAL HIGH (ref 0–37)
Albumin: 4.2 g/dL (ref 3.5–5.2)
Alkaline Phosphatase: 90 U/L (ref 39–117)
Bilirubin, Direct: 0.1 mg/dL (ref 0.0–0.3)
Total Bilirubin: 0.6 mg/dL (ref 0.2–1.2)
Total Protein: 7.5 g/dL (ref 6.0–8.3)

## 2022-05-16 LAB — LIPID PANEL
Cholesterol: 259 mg/dL — ABNORMAL HIGH (ref 0–200)
HDL: 62.7 mg/dL
LDL Cholesterol: 176 mg/dL — ABNORMAL HIGH (ref 0–99)
NonHDL: 196.46
Total CHOL/HDL Ratio: 4
Triglycerides: 102 mg/dL (ref 0.0–149.0)
VLDL: 20.4 mg/dL (ref 0.0–40.0)

## 2022-05-16 LAB — VITAMIN D 25 HYDROXY (VIT D DEFICIENCY, FRACTURES): VITD: 22.97 ng/mL — ABNORMAL LOW (ref 30.00–100.00)

## 2022-05-16 LAB — BASIC METABOLIC PANEL
BUN: 15 mg/dL (ref 6–23)
CO2: 30 mEq/L (ref 19–32)
Calcium: 9.6 mg/dL (ref 8.4–10.5)
Chloride: 98 mEq/L (ref 96–112)
Creatinine, Ser: 0.86 mg/dL (ref 0.40–1.20)
GFR: 79.44 mL/min (ref >60.00)
Glucose, Bld: 147 mg/dL — ABNORMAL HIGH (ref 70–99)
Potassium: 4.1 mEq/L (ref 3.5–5.1)
Sodium: 136 mEq/L (ref 135–145)

## 2022-05-16 LAB — TSH: TSH: 1.58 u[IU]/mL (ref 0.35–5.50)

## 2022-05-16 MED ORDER — LOSARTAN POTASSIUM 50 MG PO TABS: 50.0000 mg | ORAL_TABLET | Freq: Every day | ORAL | 3 refills | Status: DC

## 2022-05-16 NOTE — Assessment & Plan Note (Signed)
New.  Pt has likely had high blood pressure for at least the last 9 months as she was questioned about this at the time of her knee surgery.  Denies family hx.  Pt admits to gaining quite a bit of weight.  Thankfully is asymptomatic.  Check labs to r/o underlying metabolic cause.  Start Losartan due to pt's sulfa allergy (will avoid HCTZ).  Pt expressed understanding and is in agreement w/ plan.

## 2022-05-16 NOTE — Assessment & Plan Note (Signed)
New.  Pt reports that she has gained considerable weight.  Unclear as to the time table as she has not been seen since 2020.  Will check labs to risk stratify.  Encouraged low carb diet, regular exercise.  Will follow.

## 2022-05-16 NOTE — Patient Instructions (Signed)
Follow up in 3-4 weeks to recheck BP We'll notify you of your lab results and make any changes if needed START the Losartan once daily Try and limit your salt intake INCREASE your water intake Call with any questions or concerns Hang in there!!! We'll get you back on track!!!

## 2022-05-16 NOTE — Progress Notes (Signed)
   Subjective:    Patient ID: Kathy Morris, female    DOB: 1970/05/08, 52 y.o.   MRN: [id number]  HPI HTN- pt was at eye doctor yesterday and BP was elevated.  'it was 165 over something'.  Told to see Primary.  Pt has not been seen since 2020.  No CP, SOB, HA's, visual changes, edema.  Pt had knee surgery in June and was asked at that time if 'BP is normally on the high side'.  No family hx of HTN.  Pt reports gaining an 'enormous amount of weight'.     Review of Systems For ROS see HPI     Objective:   Physical Exam Vitals reviewed.  Constitutional:      General: She is not in acute distress.    Appearance: Normal appearance. She is well-developed. She is obese. She is not ill-appearing.  HENT:     Head: Normocephalic and atraumatic.  Eyes:     Conjunctiva/sclera: Conjunctivae normal.     Pupils: Pupils are equal, round, and reactive to light.  Neck:     Thyroid: No thyromegaly.  Cardiovascular:     Rate and Rhythm: Normal rate and regular rhythm.     Pulses: Normal pulses.     Heart sounds: Normal heart sounds. No murmur heard. Pulmonary:     Effort: Pulmonary effort is normal. No respiratory distress.     Breath sounds: Normal breath sounds.  Abdominal:     General: There is no distension.     Palpations: Abdomen is soft.     Tenderness: There is no abdominal tenderness.  Musculoskeletal:     Cervical back: Normal range of motion and neck supple.     Right lower leg: No edema.     Left lower leg: No edema.  Lymphadenopathy:     Cervical: No cervical adenopathy.  Skin:    General: Skin is warm and dry.  Neurological:     General: No focal deficit present.     Mental Status: She is alert and oriented to person, place, and time.  Psychiatric:        Mood and Affect: Mood normal.        Behavior: Behavior normal.        Thought Content: Thought content normal.           Assessment & Plan:

## 2022-05-19 ENCOUNTER — Telehealth: Payer: Self-pay

## 2022-05-19 ENCOUNTER — Other Ambulatory Visit (INDEPENDENT_AMBULATORY_CARE_PROVIDER_SITE_OTHER): Payer: BC Managed Care – PPO

## 2022-05-19 ENCOUNTER — Other Ambulatory Visit: Payer: Self-pay

## 2022-05-19 ENCOUNTER — Other Ambulatory Visit: Payer: BC Managed Care – PPO

## 2022-05-19 DIAGNOSIS — R7309 Other abnormal glucose: Secondary | ICD-10-CM

## 2022-05-19 DIAGNOSIS — R7989 Other specified abnormal findings of blood chemistry: Secondary | ICD-10-CM

## 2022-05-19 DIAGNOSIS — E559 Vitamin D deficiency, unspecified: Secondary | ICD-10-CM

## 2022-05-19 DIAGNOSIS — E785 Hyperlipidemia, unspecified: Secondary | ICD-10-CM

## 2022-05-19 LAB — HEMOGLOBIN A1C: Hgb A1c MFr Bld: 5.8 % (ref 4.6–6.5)

## 2022-05-19 MED ORDER — ROSUVASTATIN CALCIUM 20 MG PO TABS: 20.0000 mg | ORAL_TABLET | Freq: Every day | ORAL | 3 refills | Status: DC

## 2022-05-19 MED ORDER — VITAMIN D (ERGOCALCIFEROL) 1.25 MG (50000 UNIT) PO CAPS: 50000.0000 [IU] | ORAL_CAPSULE | ORAL | 12 refills | Status: DC

## 2022-05-19 NOTE — Telephone Encounter (Signed)
Informed pt of lab results  

## 2022-05-19 NOTE — Telephone Encounter (Signed)
-----   Message from Midge Minium, MD sent at 05/19/2022  7:36 AM EDT ----- Total cholesterol and LDL (bad cholesterol) have both increased since last check.  This will improve w/ healthy diet and regular exercise, but in the meantime, we need to start medication to lower these numbers.  Please start Crestor 20mg  nightly (#30, 3 refills).  Your liver functions are mildly elevated (AST and ALT).  Please hold tylenol and alcohol x2 weeks and we will repeat your liver enzymes at a lab only visit (LFTs- dx elevated LFTs)  Your Vit D is low.  Based on this, we need to start 50,000 units weekly x12 weeks in addition to daily OTC supplement of at least 2000 units.   Your sugar is elevated.  Based on this, we will add on an A1C to assess for possible diabetes

## 2022-05-19 NOTE — Telephone Encounter (Signed)
Informed pt of lab results . Sent in Vit D  50,000 units Crestor 20 mg and Add on A1C has been faxed and repeat liver function lab is in and apt made

## 2022-05-19 NOTE — Telephone Encounter (Signed)
-----   Message from Midge Minium, MD sent at 05/19/2022 12:25 PM EDT ----- Thankfully no evidence of diabetes.  Please work on a low carb diet and regular physical activity to improve sugars.

## 2022-05-25 ENCOUNTER — Other Ambulatory Visit: Payer: Self-pay | Admitting: Family Medicine

## 2022-05-25 DIAGNOSIS — E785 Hyperlipidemia, unspecified: Secondary | ICD-10-CM

## 2022-05-26 DIAGNOSIS — S83511D Sprain of anterior cruciate ligament of right knee, subsequent encounter: Secondary | ICD-10-CM | POA: Diagnosis not present

## 2022-05-30 ENCOUNTER — Other Ambulatory Visit (INDEPENDENT_AMBULATORY_CARE_PROVIDER_SITE_OTHER): Payer: BC Managed Care – PPO

## 2022-05-30 DIAGNOSIS — R7989 Other specified abnormal findings of blood chemistry: Secondary | ICD-10-CM

## 2022-05-30 LAB — HEPATIC FUNCTION PANEL
ALT: 85 U/L — ABNORMAL HIGH (ref 0–35)
AST: 44 U/L — ABNORMAL HIGH (ref 0–37)
Albumin: 4.4 g/dL (ref 3.5–5.2)
Alkaline Phosphatase: 82 U/L (ref 39–117)
Bilirubin, Direct: 0.1 mg/dL (ref 0.0–0.3)
Total Bilirubin: 0.5 mg/dL (ref 0.2–1.2)
Total Protein: 7.5 g/dL (ref 6.0–8.3)

## 2022-06-16 ENCOUNTER — Ambulatory Visit: Payer: BC Managed Care – PPO | Admitting: Family Medicine

## 2022-06-16 ENCOUNTER — Encounter: Payer: Self-pay | Admitting: Family Medicine

## 2022-06-16 VITALS — BP 138/82 | HR 94 | Temp 97.9°F | Resp 16 | Ht 64.0 in | Wt 253.1 lb

## 2022-06-16 DIAGNOSIS — E785 Hyperlipidemia, unspecified: Secondary | ICD-10-CM | POA: Insufficient documentation

## 2022-06-16 DIAGNOSIS — I10 Essential (primary) hypertension: Secondary | ICD-10-CM | POA: Diagnosis not present

## 2022-06-16 LAB — HEPATIC FUNCTION PANEL
ALT: 58 U/L — ABNORMAL HIGH (ref 0–35)
AST: 34 U/L (ref 0–37)
Albumin: 4.6 g/dL (ref 3.5–5.2)
Alkaline Phosphatase: 74 U/L (ref 39–117)
Bilirubin, Direct: 0.1 mg/dL (ref 0.0–0.3)
Total Bilirubin: 0.4 mg/dL (ref 0.2–1.2)
Total Protein: 7.4 g/dL (ref 6.0–8.3)

## 2022-06-16 LAB — BASIC METABOLIC PANEL
BUN: 12 mg/dL (ref 6–23)
CO2: 27 mEq/L (ref 19–32)
Calcium: 9.2 mg/dL (ref 8.4–10.5)
Chloride: 102 mEq/L (ref 96–112)
Creatinine, Ser: 0.82 mg/dL (ref 0.40–1.20)
GFR: 84.06 mL/min (ref >60.00)
Glucose, Bld: 99 mg/dL (ref 70–99)
Potassium: 4 mEq/L (ref 3.5–5.1)
Sodium: 139 mEq/L (ref 135–145)

## 2022-06-16 NOTE — Patient Instructions (Signed)
Schedule your complete physical in 6 months We'll notify you of your lab results and make any changes if needed Keep up the good work on healthy diet and regular exercise- you're doing great!!! Call with any questions or concerns Stay Safe!  Stay Healthy!! I'M SO PROUD OF YOU!!!

## 2022-06-16 NOTE — Progress Notes (Signed)
   Subjective:    Patient ID: Kathy Morris, female    DOB: 1970-04-21, 52 y.o.   MRN: [id number]  HPI HTN- at last visit BP was 160/112.  She was started on Losartan 50mg  daily.  BP today is much better at 138/82.  Is down 13 lbs since last visit.  No CP, SOB, HA's, visual changes, edema.  Obesity- pt is down 13 lbs since last visit.  Has made changes to both dietary habits and physical activity.  Elevated liver enzymes- AST/ALT were elevated in March.  Due for repeat.  Just recently started Crestor 20mg .     Review of Systems For ROS see HPI     Objective:   Physical Exam Vitals reviewed.  Constitutional:      General: She is not in acute distress.    Appearance: Normal appearance. She is well-developed. She is obese. She is not ill-appearing.  HENT:     Head: Normocephalic and atraumatic.  Eyes:     Conjunctiva/sclera: Conjunctivae normal.     Pupils: Pupils are equal, round, and reactive to light.  Neck:     Thyroid: No thyromegaly.  Cardiovascular:     Rate and Rhythm: Normal rate and regular rhythm.     Pulses: Normal pulses.     Heart sounds: Normal heart sounds. No murmur heard. Pulmonary:     Effort: Pulmonary effort is normal. No respiratory distress.     Breath sounds: Normal breath sounds.  Abdominal:     General: There is no distension.     Palpations: Abdomen is soft.     Tenderness: There is no abdominal tenderness.  Musculoskeletal:     Cervical back: Normal range of motion and neck supple.     Right lower leg: No edema.     Left lower leg: No edema.  Lymphadenopathy:     Cervical: No cervical adenopathy.  Skin:    General: Skin is warm and dry.  Neurological:     General: No focal deficit present.     Mental Status: She is alert and oriented to person, place, and time.  Psychiatric:        Mood and Affect: Mood normal.        Behavior: Behavior normal.          Assessment & Plan:

## 2022-06-17 ENCOUNTER — Telehealth: Payer: Self-pay

## 2022-06-17 NOTE — Assessment & Plan Note (Signed)
Improving!  Pt is down 13 lbs since last visit!  She reports changes to both her diet and her activity level.  Applauded her efforts and encouraged her to continue.  Will follow.

## 2022-06-17 NOTE — Assessment & Plan Note (Signed)
New.  Noted on labs done at last visit.  She was started on Crestor 20mg  daily but her baseline LFTs were also elevated.  Due to repeat today.

## 2022-06-17 NOTE — Telephone Encounter (Signed)
Left lab results on pt VM 

## 2022-06-17 NOTE — Assessment & Plan Note (Signed)
New.  Pt's BP is much improved w/ addition of Losartan 50mg  daily and her efforts at healthy diet and regular exercise.  Applauded her efforts.  Will check BMP due to use of ARB but no anticipated med changes.  Will follow.

## 2022-06-17 NOTE — Telephone Encounter (Signed)
-----   Message from Sheliah Hatch, MD sent at 06/17/2022  7:31 AM EDT ----- Liver functions are [facility] better!  Keep up the good work!

## 2022-12-23 ENCOUNTER — Encounter: Payer: Self-pay | Admitting: Family Medicine

## 2022-12-23 ENCOUNTER — Ambulatory Visit (INDEPENDENT_AMBULATORY_CARE_PROVIDER_SITE_OTHER): Payer: BC Managed Care – PPO | Admitting: Family Medicine

## 2022-12-23 ENCOUNTER — Other Ambulatory Visit (HOSPITAL_COMMUNITY)
Admission: RE | Admit: 2022-12-23 | Discharge: 2022-12-23 | Disposition: A | Discharge status: Home / Self Care | Payer: BC Managed Care – PPO | Source: Ambulatory Visit | Attending: Family Medicine | Admitting: Family Medicine

## 2022-12-23 VITALS — BP 124/80 | HR 78 | Temp 97.7°F | Ht 65.0 in | Wt 238.1 lb

## 2022-12-23 DIAGNOSIS — Z124 Encounter for screening for malignant neoplasm of cervix: Secondary | ICD-10-CM | POA: Diagnosis not present

## 2022-12-23 DIAGNOSIS — E559 Vitamin D deficiency, unspecified: Secondary | ICD-10-CM

## 2022-12-23 DIAGNOSIS — Z0000 Encounter for general adult medical examination without abnormal findings: Secondary | ICD-10-CM

## 2022-12-23 DIAGNOSIS — Z23 Encounter for immunization: Secondary | ICD-10-CM | POA: Diagnosis not present

## 2022-12-23 LAB — BASIC METABOLIC PANEL
BUN: 16 mg/dL (ref 6–23)
CO2: 26 meq/L (ref 19–32)
Calcium: 9.1 mg/dL (ref 8.4–10.5)
Chloride: 100 meq/L (ref 96–112)
Creatinine, Ser: 0.74 mg/dL (ref 0.40–1.20)
GFR: 94.73 mL/min (ref >60.00)
Glucose, Bld: 112 mg/dL — ABNORMAL HIGH (ref 70–99)
Potassium: 4 meq/L (ref 3.5–5.1)
Sodium: 136 meq/L (ref 135–145)

## 2022-12-23 LAB — HEPATIC FUNCTION PANEL
ALT: 25 U/L (ref 0–35)
AST: 22 U/L (ref 0–37)
Albumin: 4.4 g/dL (ref 3.5–5.2)
Alkaline Phosphatase: 68 U/L (ref 39–117)
Bilirubin, Direct: 0.1 mg/dL (ref 0.0–0.3)
Total Bilirubin: 0.5 mg/dL (ref 0.2–1.2)
Total Protein: 7.5 g/dL (ref 6.0–8.3)

## 2022-12-23 LAB — LIPID PANEL
Cholesterol: 161 mg/dL (ref 0–200)
HDL: 52.8 mg/dL (ref >39.00)
LDL Cholesterol: 91 mg/dL (ref 0–99)
NonHDL: 108.24
Total CHOL/HDL Ratio: 3
Triglycerides: 86 mg/dL (ref 0.0–149.0)
VLDL: 17.2 mg/dL (ref 0.0–40.0)

## 2022-12-23 LAB — CBC WITH DIFFERENTIAL/PLATELET
Basophils Absolute: 0 10*3/uL (ref 0.0–0.1)
Basophils Relative: 0.4 % (ref 0.0–3.0)
Eosinophils Absolute: 0.1 10*3/uL (ref 0.0–0.7)
Eosinophils Relative: 1.4 % (ref 0.0–5.0)
HCT: 44.4 % (ref 36.0–46.0)
Hemoglobin: 14.4 g/dL (ref 12.0–15.0)
Lymphocytes Relative: 17.8 % (ref 12.0–46.0)
Lymphs Abs: 1.3 10*3/uL (ref 0.7–4.0)
MCHC: 32.5 g/dL (ref 30.0–36.0)
MCV: 92.2 fL (ref 78.0–100.0)
Monocytes Absolute: 0.7 10*3/uL (ref 0.1–1.0)
Monocytes Relative: 10.2 % (ref 3.0–12.0)
Neutro Abs: 5.1 10*3/uL (ref 1.4–7.7)
Neutrophils Relative %: 70.2 % (ref 43.0–77.0)
Platelets: 222 10*3/uL (ref 150.0–400.0)
RBC: 4.82 Mil/uL (ref 3.87–5.11)
RDW: 12.7 % (ref 11.5–15.5)
WBC: 7.2 10*3/uL (ref 4.0–10.5)

## 2022-12-23 LAB — VITAMIN D 25 HYDROXY (VIT D DEFICIENCY, FRACTURES): VITD: 44.81 ng/mL (ref 30.00–100.00)

## 2022-12-23 LAB — TSH: TSH: 1.34 u[IU]/mL (ref 0.35–5.50)

## 2022-12-23 NOTE — Patient Instructions (Signed)
Follow up in 6 months to recheck blood pressure and cholesterol We'll notify you of your lab results and make any changes if needed Continue to work on healthy diet and regular exercise- you're doing great! Call with any questions or concerns Stay Safe!  Stay Healthy! HAPPY EARLY BIRTHDAY!!  I hope you celebrate!!!

## 2022-12-23 NOTE — Progress Notes (Signed)
   Subjective:    Patient ID: Kathy Morris, female    DOB: June 03, 1970, 52 y.o.   MRN: [id number]  HPI CPE- due for pap, colonoscopy.  UTD on mammo, Tdap.  Flu shot today.  Patient Care Team    Relationship Specialty Notifications Start End  Sheliah Hatch, MD PCP - General Family Medicine  07/27/15   Meryl Dare, MD Consulting Physician Gastroenterology  11/21/15   [facility] [place], [place]    12/23/22     Health Maintenance  Topic Date Due   Cervical Cancer Screening (HPV/Pap Cotest)  10/02/2018   Colonoscopy  11/13/2020   INFLUENZA VACCINE  10/09/2022   MAMMOGRAM  04/09/2023   DTaP/Tdap/Td (3 - Td or Tdap) 07/26/2024   HPV VACCINES  Aged Out   COVID-19 Vaccine  Discontinued   Hepatitis C Screening  Discontinued   HIV Screening  Discontinued      Review of Systems Patient reports no vision/ hearing changes, adenopathy,fever,  persistant/recurrent hoarseness , swallowing issues, chest pain, palpitations, edema, persistant/recurrent cough, hemoptysis, dyspnea (rest/exertional/paroxysmal nocturnal), gastrointestinal bleeding (melena, rectal bleeding), abdominal pain, significant heartburn, bowel changes, GU symptoms (dysuria, hematuria, incontinence), Gyn symptoms (abnormal  bleeding, pain),  syncope, focal weakness, memory loss, numbness & tingling, skin/hair/nail changes, abnormal bruising or bleeding, anxiety, or depression.   + 15 lb weight loss    Objective:   Physical Exam  General Appearance:    Alert, cooperative, no distress, appears stated age  Head:    Normocephalic, without obvious abnormality, atraumatic  Eyes:    PERRL, conjunctiva/corneas clear, EOM's intact both eyes  Ears:    Normal TM's and external ear canals, both ears  Nose:   Nares normal, septum midline, mucosa normal, no drainage    or sinus tenderness  Throat:   Lips, mucosa, and tongue normal; teeth and gums normal  Neck:   Supple, symmetrical, trachea midline, no adenopathy;     Thyroid: no enlargement/tenderness/nodules  Back:     Symmetric, no curvature, ROM normal, no CVA tenderness  Lungs:     Clear to auscultation bilaterally, respirations unlabored  Chest Wall:    No tenderness or deformity   Heart:    Regular rate and rhythm, S1 and S2 normal, no murmur, rub   or gallop  Breast Exam:    Deferred to mammo  Abdomen:     Soft, non-tender, bowel sounds active all four quadrants,    no masses, no organomegaly  Genitalia:    External genitalia normal, cervix erythematous but no CMT, uterus in normal size and position, adnexa w/out mass or tenderness, mucosa pink and moist, no lesions or discharge present  Rectal:    Normal external appearance  Extremities:   Extremities normal, atraumatic, no cyanosis or edema  Pulses:   2+ and symmetric all extremities  Skin:   Skin color, texture, turgor normal, no rashes or lesions  Lymph nodes:   Cervical, supraclavicular, and axillary nodes normal  Neurologic:   CNII-XII intact, normal strength, sensation and reflexes    throughout          Assessment & Plan:

## 2022-12-23 NOTE — Assessment & Plan Note (Signed)
Pt's PE WNL w/ exception of BMI.  UTD on mammo, Tdap.  Flu shot given today.  Pap done today.  Pt to schedule colonoscopy.  Check labs.  Anticipatory guidance provided.

## 2022-12-23 NOTE — Assessment & Plan Note (Signed)
Improving.  Pt is down 15 lbs since last visit.  Applauded her efforts at healthy diet and regular exercise.  Check labs to risk stratify.

## 2022-12-24 ENCOUNTER — Other Ambulatory Visit (INDEPENDENT_AMBULATORY_CARE_PROVIDER_SITE_OTHER): Payer: BC Managed Care – PPO

## 2022-12-24 ENCOUNTER — Other Ambulatory Visit: Payer: Self-pay

## 2022-12-24 ENCOUNTER — Telehealth: Payer: Self-pay | Admitting: Family Medicine

## 2022-12-24 ENCOUNTER — Telehealth: Payer: Self-pay

## 2022-12-24 DIAGNOSIS — E222 Syndrome of inappropriate secretion of antidiuretic hormone: Secondary | ICD-10-CM | POA: Diagnosis not present

## 2022-12-24 DIAGNOSIS — R7309 Other abnormal glucose: Secondary | ICD-10-CM

## 2022-12-24 LAB — HEMOGLOBIN A1C: Hgb A1c MFr Bld: 5.6 % (ref 4.6–6.5)

## 2022-12-24 NOTE — Telephone Encounter (Signed)
Kathy Morris -[facility] [phone]  Thin Prep Vial for Insulin HPV Pap Smear wasn't labeled and they're sending it back

## 2022-12-24 NOTE — Telephone Encounter (Signed)
This will be back later today with courier

## 2022-12-24 NOTE — Telephone Encounter (Signed)
-----   Message from Neena Rhymes sent at 12/24/2022  7:31 AM EDT ----- Sugar is mildly elevated.  We are going to add an A1C to assess for possible diabetes  Cholesterol looks MUCH better!  This is fantastic!  Remainder of labs look good!

## 2022-12-24 NOTE — Progress Notes (Unsigned)
a1

## 2022-12-25 LAB — CYTOLOGY - PAP
Comment: NEGATIVE
Diagnosis: NEGATIVE
High risk HPV: NEGATIVE

## 2022-12-26 ENCOUNTER — Telehealth: Payer: Self-pay

## 2022-12-26 NOTE — Telephone Encounter (Signed)
-----   Message from Neena Rhymes sent at 12/25/2022  4:02 PM EDT ----- Pap is normal- great news!

## 2023-02-08 ENCOUNTER — Other Ambulatory Visit: Payer: Self-pay | Admitting: Family Medicine

## 2023-02-08 DIAGNOSIS — E785 Hyperlipidemia, unspecified: Secondary | ICD-10-CM

## 2023-03-18 ENCOUNTER — Other Ambulatory Visit: Payer: Self-pay | Admitting: Family Medicine

## 2023-03-18 DIAGNOSIS — Z1231 Encounter for screening mammogram for malignant neoplasm of breast: Secondary | ICD-10-CM

## 2023-04-16 ENCOUNTER — Ambulatory Visit
Admission: RE | Admit: 2023-04-16 | Discharge: 2023-04-16 | Disposition: A | Discharge status: Home / Self Care | Payer: BC Managed Care – PPO | Source: Ambulatory Visit | Attending: Family Medicine | Admitting: Family Medicine

## 2023-04-16 DIAGNOSIS — Z1231 Encounter for screening mammogram for malignant neoplasm of breast: Secondary | ICD-10-CM

## 2023-06-07 ENCOUNTER — Other Ambulatory Visit: Payer: Self-pay | Admitting: Family Medicine

## 2023-06-07 DIAGNOSIS — E559 Vitamin D deficiency, unspecified: Secondary | ICD-10-CM

## 2023-06-15 ENCOUNTER — Ambulatory Visit: Payer: BC Managed Care – PPO | Admitting: Family Medicine

## 2023-06-15 ENCOUNTER — Encounter: Payer: Self-pay | Admitting: Family Medicine

## 2023-06-15 VITALS — BP 130/80 | HR 78 | Temp 97.9°F | Ht 65.0 in | Wt 243.2 lb

## 2023-06-15 DIAGNOSIS — E785 Hyperlipidemia, unspecified: Secondary | ICD-10-CM

## 2023-06-15 DIAGNOSIS — Z114 Encounter for screening for human immunodeficiency virus [HIV]: Secondary | ICD-10-CM

## 2023-06-15 DIAGNOSIS — Z1211 Encounter for screening for malignant neoplasm of colon: Secondary | ICD-10-CM

## 2023-06-15 DIAGNOSIS — Z1159 Encounter for screening for other viral diseases: Secondary | ICD-10-CM | POA: Diagnosis not present

## 2023-06-15 DIAGNOSIS — I10 Essential (primary) hypertension: Secondary | ICD-10-CM

## 2023-06-15 LAB — CBC WITH DIFFERENTIAL/PLATELET
Basophils Absolute: 0 10*3/uL (ref 0.0–0.1)
Basophils Relative: 0.3 % (ref 0.0–3.0)
Eosinophils Absolute: 0.1 10*3/uL (ref 0.0–0.7)
Eosinophils Relative: 2.2 % (ref 0.0–5.0)
HCT: 43 % (ref 36.0–46.0)
Hemoglobin: 14.2 g/dL (ref 12.0–15.0)
Lymphocytes Relative: 20.4 % (ref 12.0–46.0)
Lymphs Abs: 1.4 10*3/uL (ref 0.7–4.0)
MCHC: 33.1 g/dL (ref 30.0–36.0)
MCV: 91.2 fl (ref 78.0–100.0)
Monocytes Absolute: 0.7 10*3/uL (ref 0.1–1.0)
Monocytes Relative: 9.8 % (ref 3.0–12.0)
Neutro Abs: 4.5 10*3/uL (ref 1.4–7.7)
Neutrophils Relative %: 67.3 % (ref 43.0–77.0)
Platelets: 215 10*3/uL (ref 150.0–400.0)
RBC: 4.71 Mil/uL (ref 3.87–5.11)
RDW: 13.2 % (ref 11.5–15.5)
WBC: 6.7 10*3/uL (ref 4.0–10.5)

## 2023-06-15 LAB — BASIC METABOLIC PANEL WITH GFR
BUN: 16 mg/dL (ref 6–23)
CO2: 29 meq/L (ref 19–32)
Calcium: 9.3 mg/dL (ref 8.4–10.5)
Chloride: 101 meq/L (ref 96–112)
Creatinine, Ser: 0.76 mg/dL (ref 0.40–1.20)
GFR: 91.44 mL/min (ref >60.00)
Glucose, Bld: 106 mg/dL — ABNORMAL HIGH (ref 70–99)
Potassium: 4.2 meq/L (ref 3.5–5.1)
Sodium: 139 meq/L (ref 135–145)

## 2023-06-15 LAB — HEPATIC FUNCTION PANEL
ALT: 30 U/L (ref 0–35)
AST: 23 U/L (ref 0–37)
Albumin: 4.8 g/dL (ref 3.5–5.2)
Alkaline Phosphatase: 75 U/L (ref 39–117)
Bilirubin, Direct: 0.1 mg/dL (ref 0.0–0.3)
Total Bilirubin: 0.5 mg/dL (ref 0.2–1.2)
Total Protein: 7.7 g/dL (ref 6.0–8.3)

## 2023-06-15 LAB — TSH: TSH: 1.57 u[IU]/mL (ref 0.35–5.50)

## 2023-06-15 LAB — LIPID PANEL
Cholesterol: 153 mg/dL (ref 0–200)
HDL: 48.2 mg/dL (ref >39.00)
LDL Cholesterol: 88 mg/dL (ref 0–99)
NonHDL: 104.98
Total CHOL/HDL Ratio: 3
Triglycerides: 87 mg/dL (ref 0.0–149.0)
VLDL: 17.4 mg/dL (ref 0.0–40.0)

## 2023-06-15 LAB — HEMOGLOBIN A1C: Hgb A1c MFr Bld: 5.7 % (ref 4.6–6.5)

## 2023-06-15 NOTE — Assessment & Plan Note (Signed)
 Chronic problem.  Currently on Losartan 50mg  daily w/ good control.  Asymptomatic but will check labs due to ARB use.  Pt expressed understanding and is in agreement w/ plan.

## 2023-06-15 NOTE — Assessment & Plan Note (Signed)
Chronic problem.  Currently on Crestor 20mg  daily w/o difficulty.  Check labs.  Adjust meds prn

## 2023-06-15 NOTE — Assessment & Plan Note (Signed)
 Deteriorated.  Pt has gained 5 lbs since last visit.  BMI now 40.48.  She is walking daily w/ the dog.  Encouraged low carb diet and regular exercise.  Will continue to follow.

## 2023-06-15 NOTE — Progress Notes (Signed)
   Subjective:    Patient ID: Kathy Morris, female    DOB: 1970-10-29, 53 y.o.   MRN: [id number]  HPI HTN- chronic problem, on Losartan 50mg  daily.  No CP, SOB, HA's, visual changes,  Hyperlipidemia- chronic problem, on Crestor 20mg  daily.  No abd pain, N/V.  Obesity- pt has gained 5 lbs since last visit.  BMI 40.48.  Is walking daily w/ the dog.     Review of Systems For ROS see HPI     Objective:   Physical Exam Vitals reviewed.  Constitutional:      General: She is not in acute distress.    Appearance: Normal appearance. She is well-developed. She is obese. She is not ill-appearing.  HENT:     Head: Normocephalic and atraumatic.  Eyes:     Conjunctiva/sclera: Conjunctivae normal.     Pupils: Pupils are equal, round, and reactive to light.  Neck:     Thyroid: No thyromegaly.  Cardiovascular:     Rate and Rhythm: Normal rate and regular rhythm.     Pulses: Normal pulses.     Heart sounds: Normal heart sounds. No murmur heard. Pulmonary:     Effort: Pulmonary effort is normal. No respiratory distress.     Breath sounds: Normal breath sounds.  Abdominal:     General: There is no distension.     Palpations: Abdomen is soft.     Tenderness: There is no abdominal tenderness.  Musculoskeletal:     Cervical back: Normal range of motion and neck supple.     Right lower leg: No edema.     Left lower leg: No edema.  Lymphadenopathy:     Cervical: No cervical adenopathy.  Skin:    General: Skin is warm and dry.  Neurological:     General: No focal deficit present.     Mental Status: She is alert and oriented to person, place, and time.  Psychiatric:        Mood and Affect: Mood normal.        Behavior: Behavior normal.        Thought Content: Thought content normal.           Assessment & Plan:

## 2023-06-15 NOTE — Patient Instructions (Signed)
Schedule your complete physical in 6 months We'll notify you of your lab results and make any changes if needed Continue to work on healthy diet and regular exercise- you can do it! Call with any questions or concerns Stay Safe!  Stay Healthy! Happy Spring!!! 

## 2023-06-16 ENCOUNTER — Encounter: Payer: Self-pay | Admitting: Family Medicine

## 2023-06-16 ENCOUNTER — Telehealth: Payer: Self-pay

## 2023-06-16 LAB — HIV ANTIBODY (ROUTINE TESTING W REFLEX): HIV 1&2 Ab, 4th Generation: NONREACTIVE

## 2023-06-16 LAB — HEPATITIS C ANTIBODY: Hepatitis C Ab: NONREACTIVE

## 2023-06-16 NOTE — Telephone Encounter (Signed)
 Called patient to relay Dr.Tabori's note, Patient verbalized understanding

## 2023-06-16 NOTE — Telephone Encounter (Signed)
 We typically only check Vit D once a year.  I would hold the prescription dose and take a daily OTC supplement of 2000 units

## 2023-06-16 NOTE — Telephone Encounter (Signed)
-----   Message from Neena Rhymes sent at 06/16/2023  8:28 AM EDT ----- Labs look great!  No changes at this time

## 2023-06-23 ENCOUNTER — Ambulatory Visit: Payer: BC Managed Care – PPO | Admitting: Family Medicine

## 2023-07-09 ENCOUNTER — Encounter: Payer: Self-pay | Admitting: Gastroenterology

## 2023-08-08 ENCOUNTER — Other Ambulatory Visit: Payer: Self-pay | Admitting: Family Medicine

## 2023-08-08 DIAGNOSIS — E785 Hyperlipidemia, unspecified: Secondary | ICD-10-CM

## 2023-08-17 ENCOUNTER — Ambulatory Visit (AMBULATORY_SURGERY_CENTER): Admitting: *Deleted

## 2023-08-17 ENCOUNTER — Encounter: Payer: Self-pay | Admitting: Gastroenterology

## 2023-08-17 VITALS — Ht 65.0 in | Wt 220.0 lb

## 2023-08-17 DIAGNOSIS — Z800 Family history of malignant neoplasm of digestive organs: Secondary | ICD-10-CM

## 2023-08-17 MED ORDER — NA SULFATE-K SULFATE-MG SULF 17.5-3.13-1.6 GM/177ML PO SOLN: 1.0000 | Freq: Once | ORAL | 0 refills | Status: AC

## 2023-08-17 NOTE — Progress Notes (Signed)
 Pt's name and DOB verified at the beginning of the pre-visit wit 2 identifiers  Permission given to speak with  Pt denies any difficulty with ambulating,sitting, laying down or rolling side to side  Pt has no issues moving head neck or swallowing  No egg or soy allergy known to patient   No issues known to pt with past sedation with any surgeries or procedures  No FH of Malignant Hyperthermia  Pt is not on home 02   Pt is not on blood thinners   Pt denies issues with constipation   Pt is not on dialysis  Pt denise any abnormal heart rhythms   Pt denies any upcoming cardiac testing  Patient's chart reviewed by Rogena Class CNRA prior to pre-visit and patient appropriate for the [facility].  Pre-visit completed and red dot placed by patient's name on their procedure day (on provider's schedule).     Visit by phone  Pt states weight is 220 lb  IInstructions reviewed. Pt given  both [facility] main # and MD on call # prior to instructions.  Pt states understanding of instructions. Instructed pt to review instructions again prior to procedure and call main # given if has questions.. Pt states they will.   Instructed pt on where to find instructions on My Chart.

## 2023-08-31 ENCOUNTER — Encounter: Payer: Self-pay | Admitting: Gastroenterology

## 2023-08-31 ENCOUNTER — Ambulatory Visit: Admitting: Gastroenterology

## 2023-08-31 VITALS — BP 109/77 | HR 86 | Temp 98.4°F | Resp 14 | Ht 65.0 in | Wt 220.0 lb

## 2023-08-31 DIAGNOSIS — K5730 Diverticulosis of large intestine without perforation or abscess without bleeding: Secondary | ICD-10-CM

## 2023-08-31 DIAGNOSIS — D125 Benign neoplasm of sigmoid colon: Secondary | ICD-10-CM

## 2023-08-31 DIAGNOSIS — Z800 Family history of malignant neoplasm of digestive organs: Secondary | ICD-10-CM

## 2023-08-31 DIAGNOSIS — K635 Polyp of colon: Secondary | ICD-10-CM | POA: Diagnosis not present

## 2023-08-31 DIAGNOSIS — Z1211 Encounter for screening for malignant neoplasm of colon: Secondary | ICD-10-CM | POA: Diagnosis not present

## 2023-08-31 MED ORDER — SODIUM CHLORIDE 0.9 % IV SOLN: 500.0000 mL | Freq: Once | INTRAVENOUS | Status: DC

## 2023-08-31 NOTE — Progress Notes (Signed)
 BP 162/102, Labetalol given IV, MD update, vss

## 2023-08-31 NOTE — Progress Notes (Signed)
 VS by [facility]  Pt's states no medical or surgical changes since previsit or office visit.

## 2023-08-31 NOTE — Patient Instructions (Addendum)
-   1 polyp removed and sent to pathology, Diverticulosis. - Resume previous diet. - Continue present medications. - Await pathology results. - Repeat colonoscopy in 5 years for surveillance.  YOU HAD AN ENDOSCOPIC PROCEDURE TODAY AT THE [facility]:   Refer to the procedure report that was given to you for any specific questions about what was found during the examination.  If the procedure report does not answer your questions, please call your gastroenterologist to clarify.  If you requested that your care partner not be given the details of your procedure findings, then the procedure report has been included in a sealed envelope for you to review at your convenience later.  YOU SHOULD EXPECT: Some feelings of bloating in the abdomen. Passage of more gas than usual.  Walking can help get rid of the air that was put into your GI tract during the procedure and reduce the bloating. If you had a lower endoscopy (such as a colonoscopy or flexible sigmoidoscopy) you may notice spotting of blood in your stool or on the toilet paper. If you underwent a bowel prep for your procedure, you may not have a normal bowel movement for a few days.  Please Note:  You might notice some irritation and congestion in your nose or some drainage.  This is from the oxygen used during your procedure.  There is no need for concern and it should clear up in a day or so.  SYMPTOMS TO REPORT IMMEDIATELY:  Following lower endoscopy (colonoscopy or flexible sigmoidoscopy):  Excessive amounts of blood in the stool  Significant tenderness or worsening of abdominal pains  Swelling of the abdomen that is new, acute  Fever of 100F or higher   For urgent or emergent issues, a gastroenterologist can be reached at any hour by calling [phone]. Do not use MyChart messaging for urgent concerns.    DIET:  We do recommend a small meal at first, but then you may proceed to your regular diet.  Drink plenty of fluids  but you should avoid alcoholic beverages for 24 hours.  ACTIVITY:  You should plan to take it easy for the rest of today and you should NOT DRIVE or use heavy machinery until tomorrow (because of the sedation medicines used during the test).    FOLLOW UP: Our staff will call the number listed on your records the next business day following your procedure.  We will call around 7:15- 8:00 am to check on you and address any questions or concerns that you may have regarding the information given to you following your procedure. If we do not reach you, we will leave a message.     If any biopsies were taken you will be contacted by phone or by letter within the next 1-3 weeks.  Please call us  at [phone] if you have not heard about the biopsies in 3 weeks.    SIGNATURES/CONFIDENTIALITY: You and/or your care partner have signed paperwork which will be entered into your electronic medical record.  These signatures attest to the fact that that the information above on your After Visit Summary has been reviewed and is understood.  Full responsibility of the confidentiality of this discharge information lies with you and/or your care-partner.

## 2023-08-31 NOTE — Op Note (Signed)
 [facility] Patient Name: Kathy Morris Procedure Date: 08/31/2023 8:26 AM MRN: [id number] Endoscopist: Victory L. Legrand , MD, [id number] Age: 53 Referring MD:  Date of Birth: [birth year] Gender: Female Account #: [id number] Procedure:                Colonoscopy Indications:              Screening in patient at increased risk: Colorectal                            cancer in father 59 or older                           no polyps Sept 2017 (Dr. Aneita) Medicines:                Monitored Anesthesia Care Procedure:                Pre-Anesthesia Assessment:                           - Prior to the procedure, a History and Physical                            was performed, and patient medications and                            allergies were reviewed. The patient's tolerance of                            previous anesthesia was also reviewed. The risks                            and benefits of the procedure and the sedation                            options and risks were discussed with the patient.                            All questions were answered, and informed consent                            was obtained. Prior Anticoagulants: The patient has                            taken no anticoagulant or antiplatelet agents. ASA                            Grade Assessment: II - A patient with mild systemic                            disease. After reviewing the risks and benefits,                            the patient was deemed in satisfactory condition to  undergo the procedure.                           After obtaining informed consent, the colonoscope                            was passed under direct vision. Throughout the                            procedure, the patient's blood pressure, pulse, and                            oxygen saturations were monitored continuously. The                            [id number]-HQ190L Colonoscope [id number] was  introduced                            through the anus and advanced to the the cecum,                            identified by appendiceal orifice and ileocecal                            valve. The colonoscopy was performed without                            difficulty. The patient tolerated the procedure                            well. The quality of the bowel preparation was                            good. The ileocecal valve, appendiceal orifice, and                            rectum were photographed. Scope In: 8:29:51 AM Scope Out: 8:43:41 AM Scope Withdrawal Time: 0 hours 11 minutes 26 seconds  Total Procedure Duration: 0 hours 13 minutes 50 seconds  Findings:                 The perianal and digital rectal examinations were                            normal.                           Repeat examination of right colon under NBI                            performed.                           Multiple diverticula were found in the left colon.  Associated tortuosity and haustral thickening.                           A diminutive polyp was found in the sigmoid colon.                            The polyp was sessile. The polyp was removed with a                            cold snare. Resection and retrieval were complete.                           The exam was otherwise without abnormality on                            direct and retroflexion views. Complications:            No immediate complications. Estimated Blood Loss:     Estimated blood loss was minimal. Impression:               - Diverticulosis in the left colon.                           - One diminutive polyp in the sigmoid colon,                            removed with a cold snare. Resected and retrieved.                           - The examination was otherwise normal on direct                            and retroflexion views. Recommendation:           - Patient has a contact number available  for                            emergencies. The signs and symptoms of potential                            delayed complications were discussed with the                            patient. Return to normal activities tomorrow.                            Written discharge instructions were provided to the                            patient.                           - Resume previous diet.                           - Continue present medications.                           -  Await pathology results.                           - Repeat colonoscopy in 5 years for surveillance. Eris Hannan L. Legrand, MD 08/31/2023 8:49:17 AM This report has been signed electronically.

## 2023-08-31 NOTE — Progress Notes (Signed)
 Report given to PACU, vss

## 2023-08-31 NOTE — Progress Notes (Signed)
 History and Physical:  This patient presents for endoscopic testing for: Encounter Diagnosis  Name Primary?   Family history of malignant neoplasm of gastrointestinal tract Yes    Father and two cousins had CRC No polyps on this patient's last colonoscopy (Dr Aneita) in Sept 2017 Patient denies chronic abdominal pain, rectal bleeding, constipation or diarrhea.   Patient is otherwise without complaints or active issues today.   Past Medical History: Past Medical History:  Diagnosis Date   Extraocular muscle weakness of left eye    Hx of migraines    Hyperlipidemia    Hypertension    Varicose veins      Past Surgical History: Past Surgical History:  Procedure Laterality Date   acl reconstruction Right    APPENDECTOMY  01/06/2009   COLONOSCOPY     VAGINAL DELIVERY     x 2    Allergies: Allergies  Allergen Reactions   Amoxicillin Hives   Sulfa Antibiotics Hives   Tetracyclines & Related Hives    Outpatient Meds: Current Outpatient Medications  Medication Sig Dispense Refill   cholecalciferol (VITAMIN D ) 1000 units tablet Take 2,500 Units by mouth daily.     losartan  (COZAAR ) 50 MG tablet TAKE 1 TABLET BY MOUTH EVERY DAY 90 tablet 1   Multiple Vitamin (MULTIVITAMIN ADULT PO) Take by mouth.     rosuvastatin  (CRESTOR ) 20 MG tablet TAKE 1 TABLET BY MOUTH EVERY DAY 90 tablet 1   clotrimazole -betamethasone  (LOTRISONE ) cream Apply 1 application topically 2 (two) times daily. (Patient not taking: Reported on 08/31/2023) 45 g 1   Current Facility-Administered Medications  Medication Dose Route Frequency Provider Last Rate Last Admin   0.9 %  sodium chloride  infusion  500 mL Intravenous Once Danis, Harnoor Kohles L III, MD          ___________________________________________________________________ Objective   Exam:  BP (!) 189/125   Pulse (!) 104   Temp 98.4 F (36.9 C)   Resp 18   Ht 5' 5 (1.651 m)   Wt 220 lb (99.8 kg)   LMP 08/24/2023   SpO2 99%   BMI 36.61 kg/m    CV: regular , S1/S2 Resp: clear to auscultation bilaterally, normal RR and effort noted GI: soft, no tenderness, with active bowel sounds.   Assessment: Encounter Diagnosis  Name Primary?   Family history of malignant neoplasm of gastrointestinal tract Yes     Plan: Colonoscopy   The benefits and risks of the planned procedure(s) were described in detail with the patient or (when appropriate) their health care proxy.  Risks were outlined as including, but not limited to, bleeding, infection, perforation, adverse medication reaction leading to cardiac or pulmonary decompensation, pancreatitis (if ERCP).  The limitation of incomplete mucosal visualization was also discussed.  No guarantees or warranties were given.  The patient is appropriate for an endoscopic procedure in the ambulatory setting.   - Victory Brand, MD

## 2023-08-31 NOTE — Progress Notes (Signed)
 Called to room to assist during endoscopic procedure.  Patient ID and intended procedure confirmed with present staff. Received instructions for my participation in the procedure from the performing physician.

## 2023-09-01 ENCOUNTER — Telehealth: Payer: Self-pay | Admitting: *Deleted

## 2023-09-01 NOTE — Telephone Encounter (Signed)
  Follow up Call-     08/31/2023    8:13 AM  Call back number  Post procedure Call Back phone  # [phone]  Permission to leave phone message Yes     Patient questions:  Do you have a fever, pain , or abdominal swelling? No. Pain Score  0 *  Have you tolerated food without any problems? Yes.    Have you been able to return to your normal activities? Yes.    Do you have any questions about your discharge instructions: Diet   No. Medications  No. Follow up visit  No.  Do you have questions or concerns about your Care? No.  Actions: * If pain score is 4 or above: No action needed, pain <4.

## 2023-09-03 ENCOUNTER — Ambulatory Visit: Payer: Self-pay | Admitting: Gastroenterology

## 2023-09-03 LAB — SURGICAL PATHOLOGY

## 2023-12-24 ENCOUNTER — Encounter: Payer: Self-pay | Admitting: Family Medicine

## 2023-12-24 ENCOUNTER — Ambulatory Visit: Admitting: Family Medicine

## 2023-12-24 VITALS — BP 130/78 | HR 97 | Temp 97.8°F | Ht 65.0 in | Wt 245.0 lb

## 2023-12-24 DIAGNOSIS — Z0000 Encounter for general adult medical examination without abnormal findings: Secondary | ICD-10-CM

## 2023-12-24 DIAGNOSIS — Z23 Encounter for immunization: Secondary | ICD-10-CM

## 2023-12-24 DIAGNOSIS — E559 Vitamin D deficiency, unspecified: Secondary | ICD-10-CM

## 2023-12-24 LAB — HEPATIC FUNCTION PANEL
ALT: 29 U/L (ref 0–35)
AST: 20 U/L (ref 0–37)
Albumin: 4.6 g/dL (ref 3.5–5.2)
Alkaline Phosphatase: 68 U/L (ref 39–117)
Bilirubin, Direct: 0.1 mg/dL (ref 0.0–0.3)
Total Bilirubin: 0.5 mg/dL (ref 0.2–1.2)
Total Protein: 7.5 g/dL (ref 6.0–8.3)

## 2023-12-24 LAB — CBC WITH DIFFERENTIAL/PLATELET
Basophils Absolute: 0 K/uL (ref 0.0–0.1)
Basophils Relative: 0.3 % (ref 0.0–3.0)
Eosinophils Absolute: 0.2 K/uL (ref 0.0–0.7)
Eosinophils Relative: 3.2 % (ref 0.0–5.0)
HCT: 42.5 % (ref 36.0–46.0)
Hemoglobin: 14.2 g/dL (ref 12.0–15.0)
Lymphocytes Relative: 23.6 % (ref 12.0–46.0)
Lymphs Abs: 1.4 K/uL (ref 0.7–4.0)
MCHC: 33.4 g/dL (ref 30.0–36.0)
MCV: 88.6 fl (ref 78.0–100.0)
Monocytes Absolute: 0.6 K/uL (ref 0.1–1.0)
Monocytes Relative: 9.8 % (ref 3.0–12.0)
Neutro Abs: 3.7 K/uL (ref 1.4–7.7)
Neutrophils Relative %: 63.1 % (ref 43.0–77.0)
Platelets: 211 K/uL (ref 150.0–400.0)
RBC: 4.8 Mil/uL (ref 3.87–5.11)
RDW: 12.2 % (ref 11.5–15.5)
WBC: 5.8 K/uL (ref 4.0–10.5)

## 2023-12-24 LAB — BASIC METABOLIC PANEL WITH GFR
BUN: 14 mg/dL (ref 6–23)
CO2: 29 meq/L (ref 19–32)
Calcium: 9.2 mg/dL (ref 8.4–10.5)
Chloride: 102 meq/L (ref 96–112)
Creatinine, Ser: 0.77 mg/dL (ref 0.40–1.20)
GFR: 89.69 mL/min (ref >60.00)
Glucose, Bld: 108 mg/dL — ABNORMAL HIGH (ref 70–99)
Potassium: 4.1 meq/L (ref 3.5–5.1)
Sodium: 140 meq/L (ref 135–145)

## 2023-12-24 LAB — LIPID PANEL
Cholesterol: 146 mg/dL (ref 0–200)
HDL: 44.7 mg/dL (ref >39.00)
LDL Cholesterol: 80 mg/dL (ref 0–99)
NonHDL: 101.08
Total CHOL/HDL Ratio: 3
Triglycerides: 106 mg/dL (ref 0.0–149.0)
VLDL: 21.2 mg/dL (ref 0.0–40.0)

## 2023-12-24 LAB — VITAMIN D 25 HYDROXY (VIT D DEFICIENCY, FRACTURES): VITD: 33.62 ng/mL (ref 30.00–100.00)

## 2023-12-24 LAB — TSH: TSH: 1.04 u[IU]/mL (ref 0.35–5.50)

## 2023-12-24 NOTE — Progress Notes (Signed)
   Subjective:    Patient ID: Kathy Morris, female    DOB: 14-Aug-1970, 53 y.o.   MRN: [id number]  HPI CPE- UTD on mammo, pap, colonoscopy, Tdap.  Due for flu  Patient Care Team    Relationship Specialty Notifications Start End  Mahlon Comer BRAVO, MD PCP - General Family Medicine  07/27/15   Kathy Gwendlyn DASEN, MD (Inactive) Consulting Physician Gastroenterology  11/21/15   [facility] [place], [place]    12/23/22     Health Maintenance  Topic Date Due   Hepatitis B Vaccines 19-59 Average Risk (1 of 3 - 19+ 3-dose series) Never done   Pneumococcal Vaccine: 50+ Years (1 of 1 - PCV) Never done   Zoster Vaccines- Shingrix (1 of 2) Never done   Influenza Vaccine  10/09/2023   Mammogram  04/15/2024   DTaP/Tdap/Td (3 - Td or Tdap) 07/26/2024   Cervical Cancer Screening (HPV/Pap Cotest)  12/23/2027   Colonoscopy  08/30/2028   Hepatitis C Screening  Completed   HIV Screening  Completed   HPV VACCINES  Aged Out   Meningococcal B Vaccine  Aged Out   COVID-19 Vaccine  Discontinued      Review of Systems Patient reports no vision/ hearing changes, adenopathy,fever, persistant/recurrent hoarseness , swallowing issues, chest pain, palpitations, edema, persistant/recurrent cough, hemoptysis, dyspnea (rest/exertional/paroxysmal nocturnal), gastrointestinal bleeding (melena, rectal bleeding), abdominal pain, significant heartburn, bowel changes, GU symptoms (dysuria, hematuria, incontinence), Gyn symptoms (abnormal  bleeding, pain),  syncope, focal weakness, memory loss, numbness & tingling, skin/hair/nail changes, abnormal bruising or bleeding, anxiety, or depression.   + 25 lb weight gain since June    Objective:   Physical Exam General Appearance:    Alert, cooperative, no distress, appears stated age, obese  Head:    Normocephalic, without obvious abnormality, atraumatic  Eyes:    PERRL, conjunctiva/corneas clear, EOM's intact both eyes  Ears:    Normal TM's and external ear  canals, both ears  Nose:   Nares normal, septum midline, mucosa normal, no drainage    or sinus tenderness  Throat:   Lips, mucosa, and tongue normal; teeth and gums normal  Neck:   Supple, symmetrical, trachea midline, no adenopathy;    Thyroid : no enlargement/tenderness/nodules  Back:     Symmetric, no curvature, ROM normal, no CVA tenderness  Lungs:     Clear to auscultation bilaterally, respirations unlabored  Chest Wall:    No tenderness or deformity   Heart:    Regular rate and rhythm, S1 and S2 normal, no murmur, rub   or gallop  Breast Exam:    Deferred to GYN  Abdomen:     Soft, non-tender, bowel sounds active all four quadrants,    no masses, no organomegaly  Genitalia:    Deferred to GYN  Rectal:    Extremities:   Extremities normal, atraumatic, no cyanosis or edema  Pulses:   2+ and symmetric all extremities  Skin:   Skin color, texture, turgor normal, no rashes or lesions  Lymph nodes:   Cervical, supraclavicular, and axillary nodes normal  Neurologic:   CNII-XII intact, normal strength, sensation and reflexes    throughout          Assessment & Plan:

## 2023-12-24 NOTE — Patient Instructions (Addendum)
Follow up in 6 months to recheck BP and cholesterol We'll notify you of your lab results and make any changes if needed Continue to work on healthy diet and regular exercise- you can do it! Call with any questions or concerns Stay Safe!  Stay Healthy! Happy Fall!!

## 2023-12-25 ENCOUNTER — Ambulatory Visit: Payer: Self-pay | Admitting: Family Medicine

## 2023-12-25 NOTE — Assessment & Plan Note (Signed)
Pt's PE WNL w/ exception of BMI.  UTD on pap, mammo, colonoscopy, Tdap.  Flu shot given.  Check labs.  Anticipatory guidance provided.

## 2024-02-06 ENCOUNTER — Other Ambulatory Visit: Payer: Self-pay | Admitting: Family Medicine

## 2024-02-06 DIAGNOSIS — E785 Hyperlipidemia, unspecified: Secondary | ICD-10-CM

## 2024-06-23 ENCOUNTER — Ambulatory Visit: Admitting: Family Medicine
